# Patient Record
Sex: Female | Born: 1945 | Race: White | Hispanic: No | Smoking: Former smoker
Health system: Southern US, Community
[De-identification: ages and names within clinical notes are randomized; demographics above are authoritative.]

## PROBLEM LIST (undated history)

## (undated) DIAGNOSIS — N200 Calculus of kidney: Secondary | ICD-10-CM

## (undated) DIAGNOSIS — K219 Gastro-esophageal reflux disease without esophagitis: Secondary | ICD-10-CM

## (undated) DIAGNOSIS — E079 Disorder of thyroid, unspecified: Secondary | ICD-10-CM

## (undated) DIAGNOSIS — E785 Hyperlipidemia, unspecified: Secondary | ICD-10-CM

## (undated) DIAGNOSIS — E119 Type 2 diabetes mellitus without complications: Secondary | ICD-10-CM

## (undated) DIAGNOSIS — I10 Essential (primary) hypertension: Secondary | ICD-10-CM

## (undated) HISTORY — PX: CATARACT EXTRACTION: SUR2

## (undated) HISTORY — PX: KNEE ARTHROSCOPY: SUR90

## (undated) HISTORY — DX: Type 2 diabetes mellitus without complications: E11.9

## (undated) HISTORY — DX: Gastro-esophageal reflux disease without esophagitis: K21.9

## (undated) HISTORY — DX: Disorder of thyroid, unspecified: E07.9

## (undated) HISTORY — DX: Hyperlipidemia, unspecified: E78.5

## (undated) HISTORY — DX: Calculus of kidney: N20.0

## (undated) HISTORY — DX: Essential (primary) hypertension: I10

---

## 1999-03-02 ENCOUNTER — Encounter: Payer: Self-pay | Admitting: Emergency Medicine

## 1999-03-02 ENCOUNTER — Emergency Department (HOSPITAL_COMMUNITY): Admission: EM | Admit: 1999-03-02 | Discharge: 1999-03-02 | Payer: Self-pay | Admitting: Emergency Medicine

## 1999-04-23 ENCOUNTER — Encounter: Admission: RE | Admit: 1999-04-23 | Discharge: 1999-05-22 | Payer: Self-pay | Admitting: Internal Medicine

## 1999-06-20 ENCOUNTER — Encounter: Payer: Self-pay | Admitting: Urology

## 1999-06-21 ENCOUNTER — Ambulatory Visit (HOSPITAL_COMMUNITY): Admission: RE | Admit: 1999-06-21 | Discharge: 1999-07-06 | Payer: Self-pay | Admitting: Urology

## 1999-12-20 ENCOUNTER — Encounter: Payer: Self-pay | Admitting: Family Medicine

## 1999-12-20 ENCOUNTER — Encounter: Admission: RE | Admit: 1999-12-20 | Discharge: 1999-12-20 | Payer: Self-pay | Admitting: Family Medicine

## 2000-05-05 ENCOUNTER — Encounter: Admission: RE | Admit: 2000-05-05 | Discharge: 2000-05-05 | Payer: Self-pay | Admitting: Obstetrics and Gynecology

## 2000-05-05 ENCOUNTER — Encounter: Payer: Self-pay | Admitting: Obstetrics and Gynecology

## 2001-05-12 ENCOUNTER — Encounter: Payer: Self-pay | Admitting: Obstetrics and Gynecology

## 2001-05-12 ENCOUNTER — Encounter: Admission: RE | Admit: 2001-05-12 | Discharge: 2001-05-12 | Payer: Self-pay | Admitting: Obstetrics and Gynecology

## 2001-05-20 ENCOUNTER — Encounter: Payer: Self-pay | Admitting: Obstetrics and Gynecology

## 2001-05-20 ENCOUNTER — Encounter: Admission: RE | Admit: 2001-05-20 | Discharge: 2001-05-20 | Payer: Self-pay | Admitting: Obstetrics and Gynecology

## 2002-01-03 ENCOUNTER — Encounter: Payer: Self-pay | Admitting: Emergency Medicine

## 2002-01-03 ENCOUNTER — Emergency Department (HOSPITAL_COMMUNITY): Admission: EM | Admit: 2002-01-03 | Discharge: 2002-01-03 | Payer: Self-pay | Admitting: Plastic Surgery

## 2002-01-18 ENCOUNTER — Ambulatory Visit (HOSPITAL_BASED_OUTPATIENT_CLINIC_OR_DEPARTMENT_OTHER): Admission: RE | Admit: 2002-01-18 | Discharge: 2002-01-18 | Payer: Self-pay | Admitting: Urology

## 2002-01-18 ENCOUNTER — Encounter: Payer: Self-pay | Admitting: Urology

## 2002-05-21 ENCOUNTER — Encounter: Payer: Self-pay | Admitting: Obstetrics and Gynecology

## 2002-05-21 ENCOUNTER — Encounter: Admission: RE | Admit: 2002-05-21 | Discharge: 2002-05-21 | Payer: Self-pay | Admitting: Obstetrics and Gynecology

## 2003-05-25 ENCOUNTER — Encounter: Admission: RE | Admit: 2003-05-25 | Discharge: 2003-05-25 | Payer: Self-pay | Admitting: Obstetrics and Gynecology

## 2003-05-25 ENCOUNTER — Encounter: Payer: Self-pay | Admitting: Obstetrics and Gynecology

## 2003-11-18 ENCOUNTER — Ambulatory Visit (HOSPITAL_BASED_OUTPATIENT_CLINIC_OR_DEPARTMENT_OTHER): Admission: RE | Admit: 2003-11-18 | Discharge: 2003-11-18 | Payer: Self-pay | Admitting: Otolaryngology

## 2003-11-18 ENCOUNTER — Encounter (INDEPENDENT_AMBULATORY_CARE_PROVIDER_SITE_OTHER): Payer: Self-pay | Admitting: *Deleted

## 2003-11-18 ENCOUNTER — Ambulatory Visit (HOSPITAL_COMMUNITY): Admission: RE | Admit: 2003-11-18 | Discharge: 2003-11-18 | Payer: Self-pay | Admitting: Otolaryngology

## 2004-05-25 ENCOUNTER — Encounter: Admission: RE | Admit: 2004-05-25 | Discharge: 2004-05-25 | Payer: Self-pay | Admitting: Obstetrics and Gynecology

## 2004-08-31 ENCOUNTER — Ambulatory Visit: Payer: Self-pay | Admitting: Family Medicine

## 2004-09-19 ENCOUNTER — Ambulatory Visit: Payer: Self-pay | Admitting: Internal Medicine

## 2004-10-04 ENCOUNTER — Ambulatory Visit: Payer: Self-pay | Admitting: Internal Medicine

## 2004-12-03 ENCOUNTER — Ambulatory Visit: Payer: Self-pay | Admitting: Internal Medicine

## 2005-06-07 ENCOUNTER — Encounter: Admission: RE | Admit: 2005-06-07 | Discharge: 2005-06-07 | Payer: Self-pay | Admitting: Obstetrics and Gynecology

## 2005-07-21 ENCOUNTER — Encounter: Payer: Self-pay | Admitting: Internal Medicine

## 2005-07-21 LAB — CONVERTED CEMR LAB

## 2005-09-19 ENCOUNTER — Ambulatory Visit: Payer: Self-pay | Admitting: Internal Medicine

## 2005-10-02 ENCOUNTER — Ambulatory Visit: Payer: Self-pay | Admitting: Internal Medicine

## 2005-10-21 HISTORY — PX: URETEROSCOPY: SHX842

## 2006-02-18 ENCOUNTER — Encounter (INDEPENDENT_AMBULATORY_CARE_PROVIDER_SITE_OTHER): Payer: Self-pay | Admitting: Internal Medicine

## 2006-02-18 ENCOUNTER — Ambulatory Visit: Payer: Self-pay | Admitting: Family Medicine

## 2006-03-04 ENCOUNTER — Ambulatory Visit: Payer: Self-pay | Admitting: Family Medicine

## 2006-03-19 ENCOUNTER — Ambulatory Visit: Payer: Self-pay | Admitting: Internal Medicine

## 2006-04-11 ENCOUNTER — Ambulatory Visit: Payer: Self-pay | Admitting: Gastroenterology

## 2006-04-16 ENCOUNTER — Ambulatory Visit: Payer: Self-pay | Admitting: Internal Medicine

## 2006-05-08 ENCOUNTER — Ambulatory Visit: Payer: Self-pay | Admitting: Gastroenterology

## 2006-05-08 LAB — HM COLONOSCOPY

## 2006-05-14 ENCOUNTER — Ambulatory Visit: Payer: Self-pay | Admitting: Internal Medicine

## 2006-05-21 ENCOUNTER — Ambulatory Visit: Payer: Self-pay | Admitting: Internal Medicine

## 2006-06-06 ENCOUNTER — Ambulatory Visit (HOSPITAL_BASED_OUTPATIENT_CLINIC_OR_DEPARTMENT_OTHER): Admission: RE | Admit: 2006-06-06 | Discharge: 2006-06-06 | Payer: Self-pay | Admitting: Urology

## 2006-07-07 ENCOUNTER — Encounter: Admission: RE | Admit: 2006-07-07 | Discharge: 2006-07-07 | Payer: Self-pay | Admitting: Obstetrics and Gynecology

## 2006-07-13 ENCOUNTER — Emergency Department (HOSPITAL_COMMUNITY): Admission: EM | Admit: 2006-07-13 | Discharge: 2006-07-13 | Payer: Self-pay | Admitting: Family Medicine

## 2006-07-18 ENCOUNTER — Encounter: Admission: RE | Admit: 2006-07-18 | Discharge: 2006-07-18 | Payer: Self-pay | Admitting: Obstetrics and Gynecology

## 2006-09-01 ENCOUNTER — Ambulatory Visit: Payer: Self-pay | Admitting: Internal Medicine

## 2006-12-25 ENCOUNTER — Ambulatory Visit: Payer: Self-pay | Admitting: Internal Medicine

## 2006-12-25 LAB — CONVERTED CEMR LAB
ALT: 17 units/L (ref 0–40)
Albumin: 3.6 g/dL (ref 3.5–5.2)
BUN: 13 mg/dL (ref 6–23)
Chloride: 108 meq/L (ref 96–112)
Free T4: 0.6 ng/dL (ref 0.6–1.6)
Glucose, Bld: 106 mg/dL — ABNORMAL HIGH (ref 70–99)
LDL Cholesterol: 97 mg/dL (ref 0–99)
Phosphorus: 3.9 mg/dL (ref 2.3–4.6)
Total CHOL/HDL Ratio: 2.6

## 2007-02-18 ENCOUNTER — Encounter: Payer: Self-pay | Admitting: Internal Medicine

## 2007-02-18 DIAGNOSIS — I10 Essential (primary) hypertension: Secondary | ICD-10-CM | POA: Insufficient documentation

## 2007-02-18 DIAGNOSIS — E039 Hypothyroidism, unspecified: Secondary | ICD-10-CM | POA: Insufficient documentation

## 2007-02-18 DIAGNOSIS — K219 Gastro-esophageal reflux disease without esophagitis: Secondary | ICD-10-CM | POA: Insufficient documentation

## 2007-02-18 DIAGNOSIS — N200 Calculus of kidney: Secondary | ICD-10-CM | POA: Insufficient documentation

## 2007-03-04 ENCOUNTER — Ambulatory Visit: Payer: Self-pay | Admitting: Internal Medicine

## 2007-03-05 LAB — CONVERTED CEMR LAB
AST: 18 units/L (ref 0–37)
Albumin: 3.8 g/dL (ref 3.5–5.2)
Basophils Absolute: 0 10*3/uL (ref 0.0–0.1)
Basophils Relative: 0.7 % (ref 0.0–1.0)
Calcium: 9.2 mg/dL (ref 8.4–10.5)
Eosinophils Absolute: 0.1 10*3/uL (ref 0.0–0.6)
Eosinophils Relative: 2.6 % (ref 0.0–5.0)
HCT: 39.1 % (ref 36.0–46.0)
MCHC: 34.8 g/dL (ref 30.0–36.0)
MCV: 89.6 fL (ref 78.0–100.0)
Neutrophils Relative %: 52 % (ref 43.0–77.0)
Phosphorus: 3.5 mg/dL (ref 2.3–4.6)
Platelets: 153 10*3/uL (ref 150–400)
Potassium: 4.6 meq/L (ref 3.5–5.1)
RBC: 4.36 M/uL (ref 3.87–5.11)
RDW: 11.7 % (ref 11.5–14.6)
TSH: 0.83 microintl units/mL (ref 0.35–5.50)
Total Bilirubin: 0.6 mg/dL (ref 0.3–1.2)
Total Protein: 7 g/dL (ref 6.0–8.3)
Triglycerides: 108 mg/dL (ref 0–149)

## 2007-07-21 ENCOUNTER — Encounter: Admission: RE | Admit: 2007-07-21 | Discharge: 2007-07-21 | Payer: Self-pay | Admitting: Obstetrics and Gynecology

## 2007-09-07 ENCOUNTER — Ambulatory Visit: Payer: Self-pay | Admitting: Internal Medicine

## 2007-12-23 ENCOUNTER — Ambulatory Visit: Payer: Self-pay | Admitting: Family Medicine

## 2007-12-28 ENCOUNTER — Ambulatory Visit: Payer: Self-pay | Admitting: Family Medicine

## 2008-01-06 ENCOUNTER — Telehealth (INDEPENDENT_AMBULATORY_CARE_PROVIDER_SITE_OTHER): Payer: Self-pay | Admitting: *Deleted

## 2008-03-10 ENCOUNTER — Ambulatory Visit: Payer: Self-pay | Admitting: Internal Medicine

## 2008-03-10 DIAGNOSIS — E785 Hyperlipidemia, unspecified: Secondary | ICD-10-CM

## 2008-03-11 LAB — CONVERTED CEMR LAB
ALT: 19 units/L (ref 0–35)
AST: 18 units/L (ref 0–37)
Albumin: 3.8 g/dL (ref 3.5–5.2)
Alkaline Phosphatase: 68 units/L (ref 39–117)
Basophils Relative: 1.2 % — ABNORMAL HIGH (ref 0.0–1.0)
Cholesterol: 183 mg/dL (ref 0–200)
Creatinine, Ser: 0.8 mg/dL (ref 0.4–1.2)
Eosinophils Absolute: 0.1 10*3/uL (ref 0.0–0.7)
Eosinophils Relative: 2 % (ref 0.0–5.0)
GFR calc Af Amer: 94 mL/min
Glucose, Bld: 120 mg/dL — ABNORMAL HIGH (ref 70–99)
MCHC: 33.7 g/dL (ref 30.0–36.0)
MCV: 91.5 fL (ref 78.0–100.0)
Monocytes Absolute: 0.5 10*3/uL (ref 0.1–1.0)
Neutro Abs: 2.9 10*3/uL (ref 1.4–7.7)
Phosphorus: 3.8 mg/dL (ref 2.3–4.6)
RDW: 12.3 % (ref 11.5–14.6)
Sodium: 138 meq/L (ref 135–145)
VLDL: 22 mg/dL (ref 0–40)
WBC: 5.4 10*3/uL (ref 4.5–10.5)

## 2008-07-21 ENCOUNTER — Encounter: Admission: RE | Admit: 2008-07-21 | Discharge: 2008-07-21 | Payer: Self-pay | Admitting: Obstetrics and Gynecology

## 2008-09-06 ENCOUNTER — Ambulatory Visit: Payer: Self-pay | Admitting: Internal Medicine

## 2008-09-06 DIAGNOSIS — R7301 Impaired fasting glucose: Secondary | ICD-10-CM

## 2008-09-07 LAB — CONVERTED CEMR LAB
Glucose, Bld: 112 mg/dL — ABNORMAL HIGH (ref 70–99)
Hgb A1c MFr Bld: 5.9 % (ref 4.6–6.0)

## 2008-09-19 ENCOUNTER — Telehealth (INDEPENDENT_AMBULATORY_CARE_PROVIDER_SITE_OTHER): Payer: Self-pay | Admitting: *Deleted

## 2008-12-01 ENCOUNTER — Encounter (INDEPENDENT_AMBULATORY_CARE_PROVIDER_SITE_OTHER): Payer: Self-pay | Admitting: *Deleted

## 2008-12-13 ENCOUNTER — Ambulatory Visit: Payer: Self-pay | Admitting: Internal Medicine

## 2008-12-30 ENCOUNTER — Ambulatory Visit: Payer: Self-pay | Admitting: Gastroenterology

## 2009-02-03 ENCOUNTER — Ambulatory Visit: Payer: Self-pay | Admitting: Gastroenterology

## 2009-02-07 ENCOUNTER — Telehealth (INDEPENDENT_AMBULATORY_CARE_PROVIDER_SITE_OTHER): Payer: Self-pay | Admitting: *Deleted

## 2009-03-24 ENCOUNTER — Ambulatory Visit: Payer: Self-pay | Admitting: Internal Medicine

## 2009-03-27 LAB — CONVERTED CEMR LAB
BUN: 14 mg/dL (ref 6–23)
Basophils Absolute: 0 10*3/uL (ref 0.0–0.1)
Bilirubin, Direct: 0.1 mg/dL (ref 0.0–0.3)
CO2: 31 meq/L (ref 19–32)
Cholesterol: 196 mg/dL (ref 0–200)
Eosinophils Absolute: 0.2 10*3/uL (ref 0.0–0.7)
LDL Cholesterol: 96 mg/dL (ref 0–99)
Lymphocytes Relative: 34.5 % (ref 12.0–46.0)
Lymphs Abs: 2 10*3/uL (ref 0.7–4.0)
Monocytes Absolute: 0.6 10*3/uL (ref 0.1–1.0)
Neutrophils Relative %: 52.3 % (ref 43.0–77.0)
Platelets: 156 10*3/uL (ref 150.0–400.0)
Potassium: 4.1 meq/L (ref 3.5–5.1)
RBC: 4.35 M/uL (ref 3.87–5.11)
RDW: 11.9 % (ref 11.5–14.6)
Sodium: 144 meq/L (ref 135–145)
TSH: 2.03 microintl units/mL (ref 0.35–5.50)
WBC: 5.8 10*3/uL (ref 4.5–10.5)

## 2009-04-10 ENCOUNTER — Ambulatory Visit: Payer: Self-pay | Admitting: Gastroenterology

## 2009-05-26 ENCOUNTER — Ambulatory Visit: Payer: Self-pay | Admitting: Gastroenterology

## 2009-06-19 ENCOUNTER — Telehealth: Payer: Self-pay | Admitting: Internal Medicine

## 2009-07-24 ENCOUNTER — Encounter: Admission: RE | Admit: 2009-07-24 | Discharge: 2009-07-24 | Payer: Self-pay | Admitting: Obstetrics and Gynecology

## 2009-09-26 ENCOUNTER — Ambulatory Visit: Payer: Self-pay | Admitting: Internal Medicine

## 2009-09-26 DIAGNOSIS — R32 Unspecified urinary incontinence: Secondary | ICD-10-CM

## 2009-10-11 ENCOUNTER — Ambulatory Visit: Payer: Self-pay | Admitting: Internal Medicine

## 2010-03-21 ENCOUNTER — Telehealth: Payer: Self-pay | Admitting: Internal Medicine

## 2010-03-21 ENCOUNTER — Emergency Department (HOSPITAL_COMMUNITY): Admission: EM | Admit: 2010-03-21 | Discharge: 2010-03-21 | Payer: Self-pay | Admitting: Internal Medicine

## 2010-03-27 ENCOUNTER — Ambulatory Visit: Payer: Self-pay | Admitting: Internal Medicine

## 2010-03-27 LAB — CONVERTED CEMR LAB
Glucose, Urine, Semiquant: NEGATIVE
Ketones, urine, test strip: NEGATIVE
Nitrite: NEGATIVE
Specific Gravity, Urine: 1.01
pH: 7.5

## 2010-03-29 LAB — CONVERTED CEMR LAB
AST: 17 units/L (ref 0–37)
Basophils Absolute: 0.1 10*3/uL (ref 0.0–0.1)
Basophils Relative: 1.1 % (ref 0.0–3.0)
Bilirubin, Direct: 0.1 mg/dL (ref 0.0–0.3)
CO2: 29 meq/L (ref 19–32)
Calcium: 9.7 mg/dL (ref 8.4–10.5)
Cholesterol: 183 mg/dL (ref 0–200)
Eosinophils Relative: 1.4 % (ref 0.0–5.0)
GFR calc non Af Amer: 65.74 mL/min (ref >60)
Glucose, Bld: 113 mg/dL — ABNORMAL HIGH (ref 70–99)
Hemoglobin: 13.4 g/dL (ref 12.0–15.0)
Lymphocytes Relative: 23.6 % (ref 12.0–46.0)
Lymphs Abs: 1.5 10*3/uL (ref 0.7–4.0)
MCV: 88.8 fL (ref 78.0–100.0)
Neutro Abs: 4.1 10*3/uL (ref 1.4–7.7)
Neutrophils Relative %: 65.5 % (ref 43.0–77.0)
Phosphorus: 3.5 mg/dL (ref 2.3–4.6)
Platelets: 162 10*3/uL (ref 150.0–400.0)
Potassium: 4.6 meq/L (ref 3.5–5.1)
RBC: 4.38 M/uL (ref 3.87–5.11)
RDW: 13.1 % (ref 11.5–14.6)
TSH: 1.54 microintl units/mL (ref 0.35–5.50)
Total Bilirubin: 0.8 mg/dL (ref 0.3–1.2)
Triglycerides: 135 mg/dL (ref 0.0–149.0)
VLDL: 27 mg/dL (ref 0.0–40.0)

## 2010-03-30 ENCOUNTER — Telehealth: Payer: Self-pay | Admitting: Internal Medicine

## 2010-03-31 ENCOUNTER — Encounter: Payer: Self-pay | Admitting: Internal Medicine

## 2010-04-06 ENCOUNTER — Telehealth (INDEPENDENT_AMBULATORY_CARE_PROVIDER_SITE_OTHER): Payer: Self-pay | Admitting: *Deleted

## 2010-06-15 ENCOUNTER — Telehealth: Payer: Self-pay | Admitting: Internal Medicine

## 2010-06-15 ENCOUNTER — Ambulatory Visit: Payer: Self-pay | Admitting: Cardiology

## 2010-06-15 ENCOUNTER — Encounter: Payer: Self-pay | Admitting: Internal Medicine

## 2010-06-15 ENCOUNTER — Ambulatory Visit: Payer: Self-pay | Admitting: Family Medicine

## 2010-06-15 ENCOUNTER — Observation Stay (HOSPITAL_COMMUNITY): Admission: EM | Admit: 2010-06-15 | Discharge: 2010-06-16 | Payer: Self-pay | Admitting: Emergency Medicine

## 2010-06-15 LAB — CONVERTED CEMR LAB

## 2010-06-16 LAB — CONVERTED CEMR LAB
AST: 30 units/L (ref 0–37)
Albumin: 4 g/dL (ref 3.5–5.2)
Alkaline Phosphatase: 86 units/L (ref 39–117)
Basophils Absolute: 0.1 10*3/uL (ref 0.0–0.1)
Basophils Relative: 0.3 % (ref 0.0–3.0)
Bilirubin, Direct: 0.2 mg/dL (ref 0.0–0.3)
CO2: 24 meq/L (ref 19–32)
Eosinophils Absolute: 0 10*3/uL (ref 0.0–0.7)
Eosinophils Relative: 0 % (ref 0.0–5.0)
GFR calc non Af Amer: 39.17 mL/min (ref >60)
Glucose, Bld: 169 mg/dL — ABNORMAL HIGH (ref 70–99)
Neutro Abs: 17.9 10*3/uL — ABNORMAL HIGH (ref 1.4–7.7)
Platelets: 155 10*3/uL (ref 150.0–400.0)
Potassium: 4.3 meq/L (ref 3.5–5.1)
Sodium: 140 meq/L (ref 135–145)
WBC: 19.4 10*3/uL (ref 4.5–10.5)

## 2010-06-18 ENCOUNTER — Ambulatory Visit (HOSPITAL_COMMUNITY): Admission: RE | Admit: 2010-06-18 | Discharge: 2010-06-18 | Payer: Self-pay | Admitting: Urology

## 2010-06-29 ENCOUNTER — Encounter: Payer: Self-pay | Admitting: Internal Medicine

## 2010-07-21 HISTORY — PX: LITHOTRIPSY: SUR834

## 2010-07-27 ENCOUNTER — Encounter: Admission: RE | Admit: 2010-07-27 | Discharge: 2010-07-27 | Payer: Self-pay | Admitting: Internal Medicine

## 2010-07-27 LAB — HM MAMMOGRAPHY

## 2010-07-30 ENCOUNTER — Encounter: Payer: Self-pay | Admitting: Internal Medicine

## 2010-09-23 IMAGING — MG MM SCREEN MAMMOGRAM BILATERAL
4 series · 4 of 4 positions shown · non-contrast
Comparison: none

DG SCREEN MAMMOGRAM BILATERAL
Bilateral CC and MLO view(s) were taken.

DIGITAL SCREENING MAMMOGRAM WITH CAD:
The breast tissue is heterogeneously dense.  No masses or malignant type calcifications are 
identified.  Compared with prior studies.

[R CC]
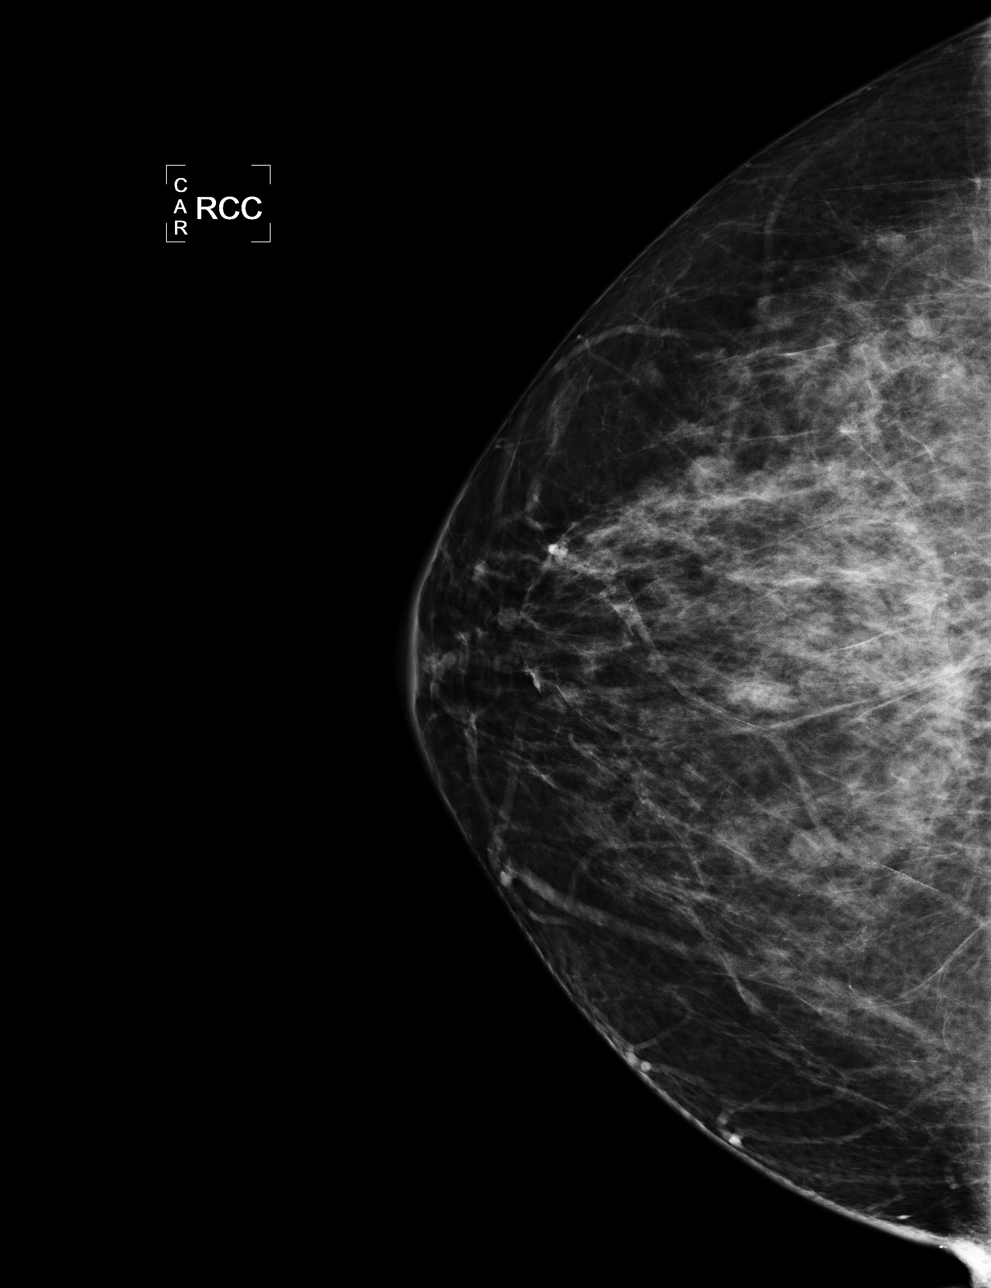

[L CC]
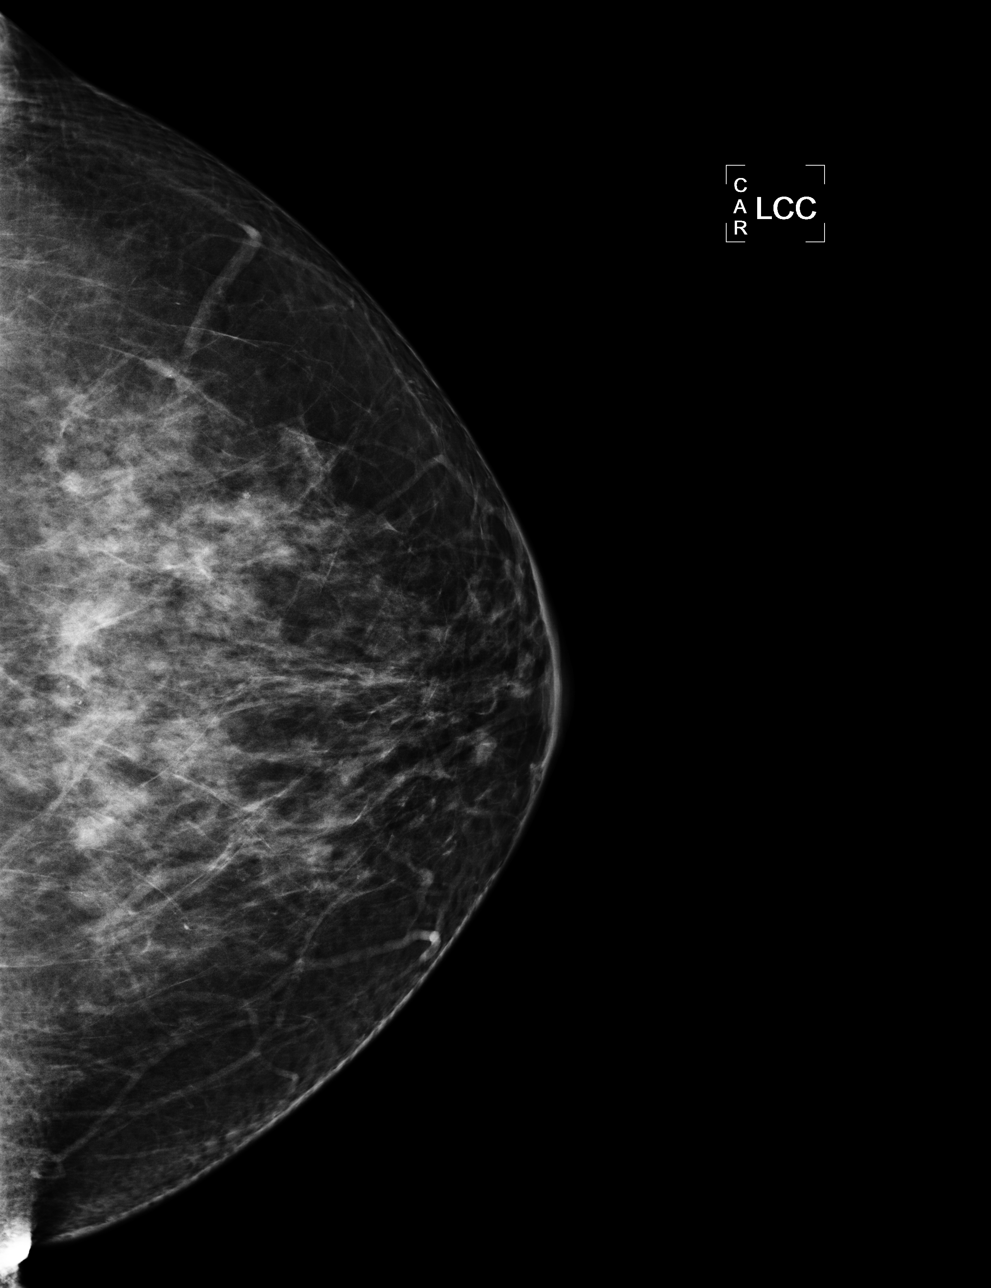

[L MLO]
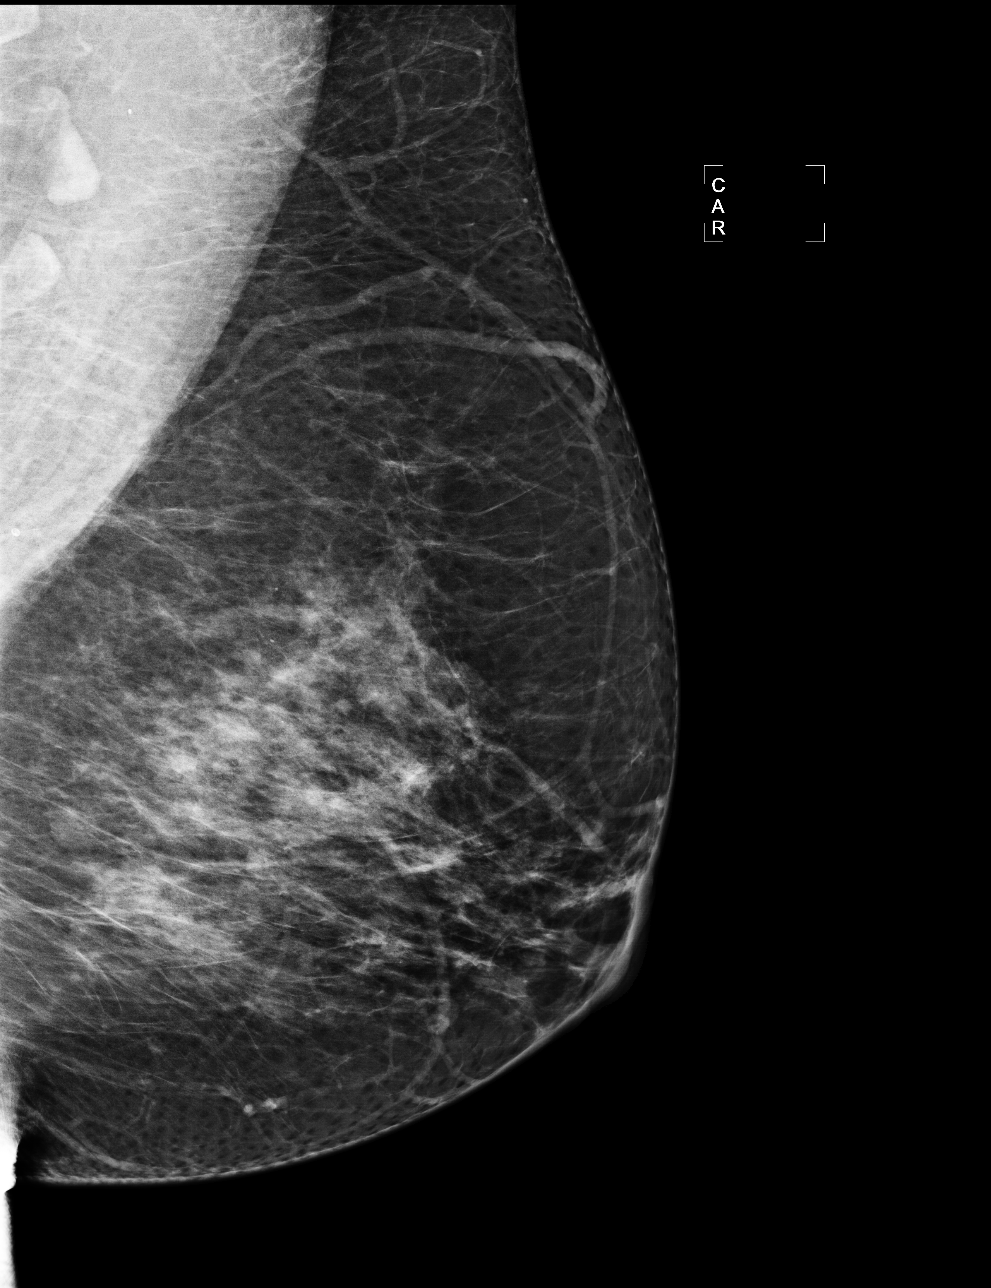

[R MLO]
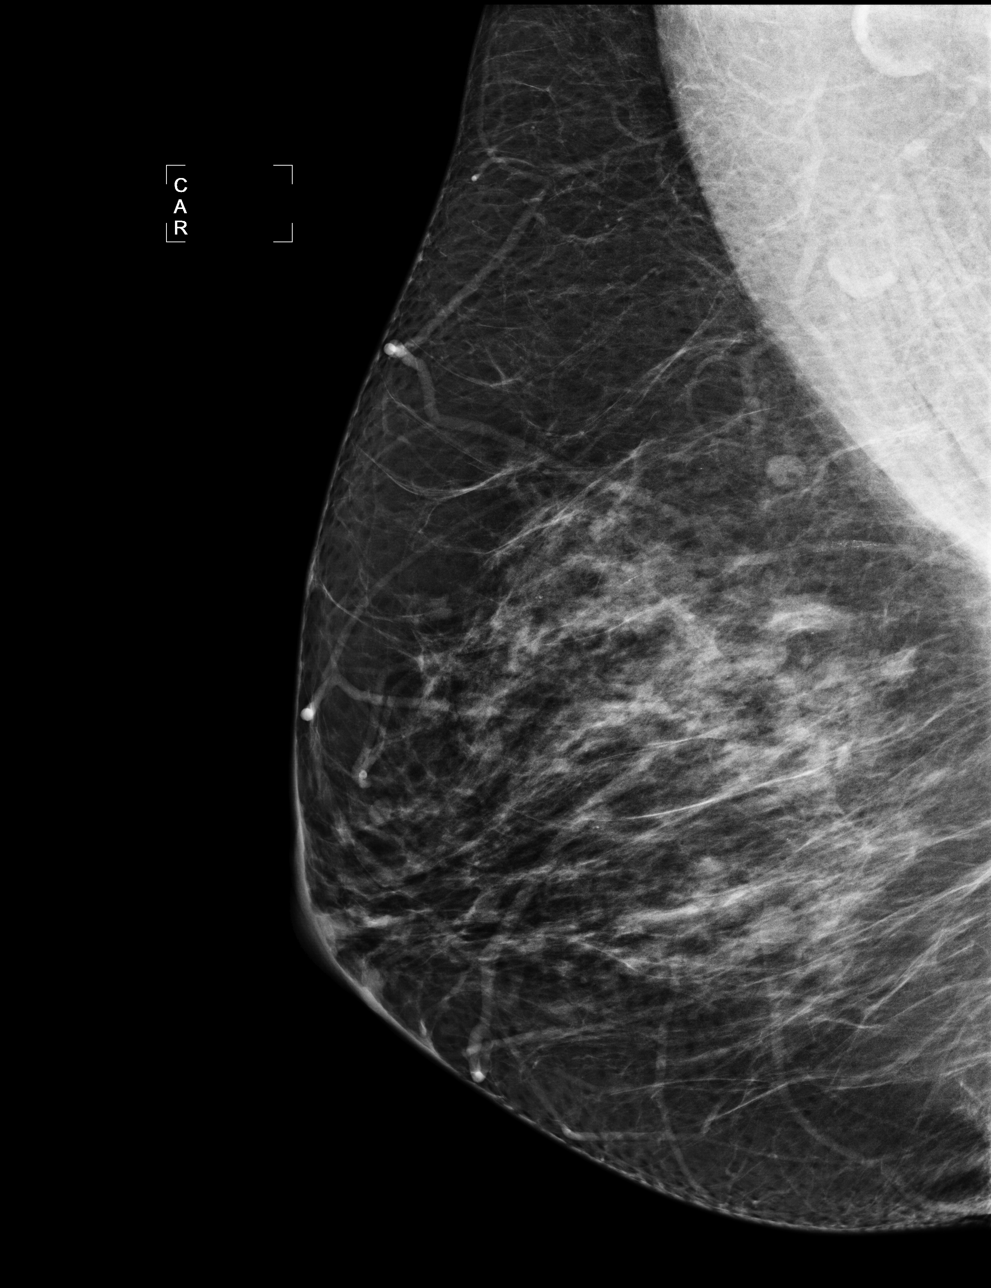

[4 of 4 positions shown; findings below may reference images not displayed]

IMPRESSION: No specific mammographic evidence of malignancy.  Next screening mammogram is recommended in one 
year.

ASSESSMENT: Negative - BI-RADS 1

Screening mammogram in 1 year.
ANALYZED BY COMPUTER AIDED DETECTION. , THIS PROCEDURE WAS A DIGITAL MAMMOGRAM.

## 2010-09-28 ENCOUNTER — Ambulatory Visit: Payer: Self-pay | Admitting: Internal Medicine

## 2010-10-08 ENCOUNTER — Ambulatory Visit: Payer: Self-pay | Admitting: Internal Medicine

## 2010-10-08 DIAGNOSIS — T887XXA Unspecified adverse effect of drug or medicament, initial encounter: Secondary | ICD-10-CM | POA: Insufficient documentation

## 2010-10-12 ENCOUNTER — Encounter: Payer: Self-pay | Admitting: Internal Medicine

## 2010-11-11 ENCOUNTER — Encounter: Payer: Self-pay | Admitting: Obstetrics and Gynecology

## 2010-11-20 NOTE — Progress Notes (Signed)
Summary: Critical Lab WBC 19.4  Phone Note Other Incoming   Caller:  [facility] Summary of Call: Critical Labs pt's  WCB is 19.4. Clydie Braun will fax report. Initial call taken by: Liane Comber CMA Duncan Dull),  June 15, 2010 1:26 PM  Follow-up for Phone Call        Dr.Vera Furniss and Dr.Copland are not in this afternoon, will forward to Dr.Duncan. DeShannon Smith CMA Duncan Dull)  June 15, 2010 2:02 PM   Additional Follow-up for Phone Call Additional follow up Details #1::        I have d/w Dr. Patsy Lager.  We both agree that patient should start on cipro 500mg  by mouth two times a day x10d.  #20, no rf.  Please call this in and notify patient.  If she has progressive pain or if she has fever, then she should go to ER over the weekend.  O/w she is to follow up with Uro on Monday.  Additional Follow-up by: Crawford Givens MD,  June 15, 2010 2:10 PM    Additional Follow-up for Phone Call Additional follow up Details #2::    left message advising patient of results and medication called to [facility] also advised patient or go to er if any symptoms worsen or she get a fever. Patient also advised to call office if any questions Follow-up by: Benny Lennert CMA Duncan Dull),  June 15, 2010 2:21 PM  New/Updated Medications: CIPRO 500 MG TABS (CIPROFLOXACIN HCL) take one tablet two times daily Prescriptions: CIPRO 500 MG TABS (CIPROFLOXACIN HCL) take one tablet two times daily  #20 x 0   Entered by:   Benny Lennert CMA (AAMA)   Authorized by:   Crawford Givens MD   Signed by:   Benny Lennert CMA (AAMA) on 06/15/2010   Method used:   Electronically to        [facility]* (retail)       [address]       [place], [place]  [address]       Ph: [phone]       Fax: [phone]   RxID:   [id number]   Appended Document: Critical Lab WBC 19.4 attempted to reach patient 2 more times with no success.Consuello Masse CMA

## 2010-11-20 NOTE — Medication Information (Signed)
Summary: Approved/[facility]  Approved/[facility]   Imported By: Lester Newtown Grant 04/07/2010 09:54:20  _____________________________________________________________________  External Attachment:    Type:   Image     Comment:   External Document

## 2010-11-20 NOTE — Assessment & Plan Note (Signed)
Summary: RIGHT SIDE PAIN/CLE   Vital Signs:  Patient profile:   65 year old female Height:      67 inches Weight:      208.8 pounds BMI:     32.82 Temp:     98.2 degrees F oral Pulse rate:   72 / minute Pulse rhythm:   regular BP sitting:   140 / 90  (left arm) Cuff size:   large  Vitals Entered By: Benny Lennert CMA Duncan Dull) (June 15, 2010 9:10 AM)  History of Present Illness: Chief complaint Right side  pain  65 year old female:  pleasant female, status post hysterectomy, also status post bladder tack in the past with a history of nephrolithiasis x4 who presents with awakening in the evening with 10-10 abdominal pain. This is diffuse and on the right side, however more in the epigastric, right upper quadrant and right lower quadrant areas.  She did have a fever up to 99.72F. She has been nauseous and vomiting. No diarrhea. No URI type symptoms.  Some flank pain, however no dysuria or urgency.  Kidney stones in the past Has had about four  Started about ten thirty  10/10 pain 99.5  throwing up some clear Has been drinking a lot.   s/p hysterectomy bladder tack   ROS: as above, no cough, chest pain, fatigue, rashes.  Allergies: 1)  ! Aspirin (Aspirin) 2)  ! Augmentin 3)  ! Codeine Sulfate (Codeine Sulfate) 4)  ! Benadryl (Diphenhydramine Hcl) 5)  ! Lodine 6)  ! Lisinopril (Lisinopril)  Past History:  Past medical, surgical, family and social histories (including risk factors) reviewed, and no changes noted (except as noted below).  Past Medical History: Reviewed history from 09/26/2009 and no changes required. GERD Hypertension Hypothyroidism Hyperlipidemia Impaired fasting glucose Urinary incontinence--------------------------------Dr Dahlstedt  Dr Starr Sinclair  Past Surgical History: Reviewed history from 12/30/2008 and no changes required. CT head neg. 12/20/1999 Sttress ECHO neg. 12/24/1999 Event monitor neg. 01/02/2000 Ureteroscopy-    caliceal diverticulum 2007   Family History: Reviewed history from 12/30/2008 and no changes required. Dad had MI, HTN, arthritis Mom had DM, HTN    Died of Altzheirmer's @84  (4?08) Lung cancer in [place] GF died of MI Granddaughter with IDDM   Social History: Reviewed history from 12/30/2008 and no changes required. 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 No apparent alopecia or balding. Ears:  no external deformities.   Nose:  no external deformity.   Neck:  No deformities, masses, or tenderness noted. Lungs:  Normal respiratory effort, chest expands symmetrically. Lungs are clear to auscultation, no crackles or wheezes. Heart:  Normal rate and regular rhythm. S1 and S2 normal without gallop, murmur, click, rub or other extra sounds. Abdomen:  distended abdomen. Soft. Active bowel sounds. No guarding. No rigidity. No rebound tenderness.  There is tenderness to palpation, moderately present in the epigastric region, right upper quadrant, and right lower quadrant.  No hepatosplenomegaly. No hernias. Extremities:  No clubbing, cyanosis, edema, or deformity noted with normal full range of motion of all joints.   Neurologic:  alert & oriented X3 and gait normal.     Impression & Recommendations:  Problem # 1:  ABDOMINAL PAIN, GENERALIZED (ICD-789.07) ill-defined abdominal pain.  Obtained a CT of the abdomen and pelvis without contrast to evaluate for potential renal stones,  appendicitis, gallbladder disease, bowel perforation, diverticulitis. Believe this needs to be without contrast the best defined whether or not a stone is present which is highest on my differential.  Orders: Radiology Referral (Radiology) Venipuncture  ([address]109) TLB-BMP (Basic Metabolic Panel-BMET) (80048-METABOL) TLB-CBC Platelet - w/Differential (85025-CBCD) TLB-Hepatic/Liver Function Pnl (80076-HEPATIC) TLB-Lipase (83690-LIPASE)  Problem # 2:  ABDOMINAL PAIN, RIGHT LOWER QUADRANT (ICD-789.03)  Orders: UA Dipstick w/o Micro (manual) (60454)  Complete Medication List: 1)  Synthroid 100 Mcg Tabs (Levothyroxine sodium) .... Take one by mouth daily 2)  Tenormin 100 Mg Tabs (Atenolol) .... Take one by mouth daily 3)  Lipitor 20 Mg Tabs (Atorvastatin calcium) .... Take one by mouth daily 4)  Vesicare 5 Mg Tabs (Solifenacin succinate) .Marland Kitchen.. 1 tablet by mouth once daily 5)  Omeprazole 20 Mg Cpdr (Omeprazole) .Marland Kitchen.. 1 daily about 30 minutes before eating for acid reflux 6)  Hydrocodone-acetaminophen 5-500 Mg Tabs (Hydrocodone-acetaminophen) .Marland Kitchen.. 1 - 2 by mouth q 4 hours as needed pain  Patient Instructions: 1)  Referral Appointment Information 2)  Day/Date: 3)  Time: 4)  Place/MD: 5)  Address: 6)  Phone/Fax: 7)  Patient given appointment information. Information/Orders faxed/mailed.  Prescriptions: HYDROCODONE-ACETAMINOPHEN 5-500 MG TABS (HYDROCODONE-ACETAMINOPHEN) 1 - 2 by mouth q 4 hours as needed pain  #40 x 0   Entered and Authorized by:   Hannah Beat MD   Signed by:   Hannah Beat MD on 06/15/2010   Method used:   Print then Give to Patient   RxID:   [id number]   Current Allergies (reviewed today): ! ASPIRIN (ASPIRIN) ! AUGMENTIN ! CODEINE SULFATE (CODEINE SULFATE) ! BENADRYL (DIPHENHYDRAMINE HCL) ! LODINE ! LISINOPRIL (LISINOPRIL)  Laboratory Results   Urine Tests  Date/Time Received: June 15, 2010 9:17 AM  Date/Time Reported: June 15, 2010 9:17 AM    Blood: small   (Normal Range: Negative)    Comments: Patient had taken azo so i could not get any other measure ments also patient only gave enough urine to do ua

## 2010-11-20 NOTE — Assessment & Plan Note (Signed)
Summary: CPX /RBH   Vital Signs:  Patient profile:   65 year old female Weight:      209 pounds BMI:     32.85 Temp:     99.2 degrees F oral Pulse rate:   68 / minute Pulse rhythm:   regular BP sitting:   150 / 90  (left arm) Cuff size:   large  Vitals Entered By: Mervin Hack CMA Duncan Dull) (March 27, 2010 8:55 AM) CC: adult physical/ sinus infection/UTI   History of Present Illness: Due to see  Dr Retta Diones later this month having urinary symptoms now though some symptoms for 2 weeks tried azo some dysuria and increased freq no fever  Seen in ER CT of head showed sinus opacification has had some right ear pain Vick's sinex helped some  Not due for pap smear this year Goes yearly to breast center for exam and mammo in October    Allergies: 1)  ! Aspirin (Aspirin) 2)  ! Augmentin 3)  ! Codeine Sulfate (Codeine Sulfate) 4)  ! Benadryl (Diphenhydramine Hcl) 5)  ! Lodine 6)  ! Lisinopril (Lisinopril)  Past History:  Past medical, surgical, family and social histories (including risk factors) reviewed for relevance to current acute and chronic problems.  Past Medical History: Reviewed history from 09/26/2009 and no changes required. GERD Hypertension Hypothyroidism Hyperlipidemia Impaired fasting glucose Urinary incontinence--------------------------------Dr Dahlstedt  Dr Starr Sinclair  Past Surgical History: Reviewed history from 12/30/2008 and no changes required. CT head neg. 12/20/1999 Sttress ECHO neg. 12/24/1999 Event monitor neg. 01/02/2000 Ureteroscopy-   caliceal diverticulum 2007   Family History: Reviewed history from 12/30/2008 and no changes required. Dad had MI, HTN, arthritis Mom had DM, HTN    Died of Altzheirmer's @84  (4?08) Lung cancer in [place] GF died of MI Granddaughter with IDDM   Social History: Reviewed history from 12/30/2008 and no changes required. 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  Occ daytime sleepiness but no abnormal somnolence wears seat belt. Eyes:  Denies double vision and vision loss-1 eye; cataract not ready for surgery yet--6 month recall though. ENT:  Complains of decreased hearing; denies ringing in ears; right ear decreased hearing in last week teeth fine--regular with dentist. CV:  Complains of fainting; denies chest pain or discomfort, difficulty breathing at night, difficulty breathing while lying down, palpitations, and shortness of breath with exertion; had syncope after stool --ER eval negative No exercise--did buy treadmill and had tried some till work schedule worsened. Resp:  Complains of cough; denies shortness of breath; some cough this AM. GI:  Complains of indigestion; denies abdominal pain, bloody stools, change in bowel habits, dark tarry stools, nausea, and vomiting; occ reflux---meds do help. GU:  Complains of dysuria and incontinence; No sex--no problem. MS:  Complains of joint pain; denies joint swelling; right 2nd finger pain chronically. Derm:  Denies lesion(s) and rash. Neuro:  Complains of headaches; denies numbness, tingling, and weakness; only occ headaches. Psych:  Denies anxiety and depression. Heme:  Denies abnormal bruising and enlarge lymph nodes. Allergy:  Complains of seasonal allergies and sneezing; occ problems after mowing.  Physical Exam  General:  alert and normal appearance.   Eyes:  pupils equal, pupils round, pupils reactive to light, and no optic disk abnormalities.   Ears:  R ear normal and L ear normal.   Mouth:  no erythema, no exudates, and no lesions.   Neck:  supple, no masses, no thyromegaly, no carotid bruits, and no cervical lymphadenopathy.   Lungs:  normal respiratory effort and normal breath sounds.   Heart:  normal rate, regular rhythm, no murmur, and no gallop.    Abdomen:  soft, non-tender, and no masses.   Msk:  no joint tenderness and no joint swelling.   Pulses:  faint in feet Extremities:  no sig edema Neurologic:  alert & oriented X3, strength normal in all extremities, and gait normal.   Skin:  no rashes and no suspicious lesions.   Axillary Nodes:  No palpable lymphadenopathy Psych:  normally interactive, good eye contact, not anxious appearing, and not depressed appearing.     Impression & Recommendations:  Problem # 1:  HEALTH MAINTENANCE EXAM (ICD-V70.0) Assessment Comment Only will get mammo later this year not due for pap discussed fitness  Problem # 2:  URINARY INCONTINENCE (ICD-788.30) Assessment: Comment Only satisfied with med  Problem # 3:  ACUTE CYSTITIS (ICD-595.0) Assessment: New  will treat for 10 days since possible sinus infection also  Her updated medication list for this problem includes:    Vesicare 5 Mg Tabs (Solifenacin succinate) .Marland Kitchen... 1 tablet by mouth once daily    Cefuroxime Axetil 250 Mg Tabs (Cefuroxime axetil) .Marland Kitchen... 1 by mouth two times a day for urinary infection  Orders: UA Dipstick w/o Micro (manual) (16109)  Problem # 4:  HYPERTENSION (ICD-401.9) Assessment: Improved  BP some better  no changes  Her updated medication list for this problem includes:    Tenormin 100 Mg Tabs (Atenolol) .Marland Kitchen... Take one by mouth daily  BP today: 150/90 Prior BP: 160/100 (10/11/2009)  Labs Reviewed: K+: 4.1 (03/24/2009) Creat: : 0.9 (03/24/2009)   Chol: 196 (03/24/2009)   HDL: 68.00 (03/24/2009)   LDL: 96 (03/24/2009)   TG: 158.0 (03/24/2009)  Orders: TLB-Renal Function Panel (80069-RENAL) TLB-CBC Platelet - w/Differential (85025-CBCD)  Problem # 5:  HYPERLIPIDEMIA (ICD-272.4) Assessment: Unchanged  due for labs  Her updated medication list for this problem includes:    Lipitor 20 Mg Tabs (Atorvastatin calcium) .Marland Kitchen... Take one by mouth daily  Labs Reviewed: SGOT: 22 (03/24/2009)   SGPT: 31  (03/24/2009)   HDL:68.00 (03/24/2009), 66.4 (03/10/2008)  LDL:96 (03/24/2009), 94 (03/10/2008)  Chol:196 (03/24/2009), 183 (03/10/2008)  Trig:158.0 (03/24/2009), 111 (03/10/2008)  Orders: TLB-Lipid Panel (80061-LIPID) TLB-Hepatic/Liver Function Pnl (80076-HEPATIC) Venipuncture (60454)  Problem # 6:  HYPOTHYROIDISM (ICD-244.9) Assessment: Unchanged  due for labs  Her updated medication list for this problem includes:    Synthroid 100 Mcg Tabs (Levothyroxine sodium) .Marland Kitchen... Take one by mouth daily  Labs Reviewed: TSH: 2.03 (03/24/2009)    HgBA1c: 5.9 (09/06/2008) Chol: 196 (03/24/2009)   HDL: 68.00 (03/24/2009)   LDL: 96 (03/24/2009)   TG: 158.0 (03/24/2009)  Orders: TLB-T4 (Thyrox), Free [id number]) TLB-TSH (Thyroid Stimulating Hormone) (84443-TSH)  Complete Medication List: 1)  Synthroid 100 Mcg Tabs (Levothyroxine sodium) .... Take one by mouth daily 2)  Tenormin 100 Mg Tabs (Atenolol) .... Take one by mouth daily 3)  Lipitor 20 Mg Tabs (Atorvastatin calcium) .... Take one by mouth daily 4)  Vesicare 5 Mg Tabs (Solifenacin succinate) .Marland Kitchen.. 1 tablet by mouth once daily 5)  Omeprazole 20 Mg Cpdr (Omeprazole) .Marland Kitchen.. 1 daily about 30 minutes before eating for acid reflux 6)  Cefuroxime Axetil 250 Mg Tabs (Cefuroxime axetil) .Marland Kitchen.. 1 by mouth two times a day for urinary infection  Patient Instructions: 1)  Please schedule a follow-up  appointment in 6 months .  Prescriptions: CEFUROXIME AXETIL 250 MG TABS (CEFUROXIME AXETIL) 1 by mouth two times a day for urinary infection  #20 x 0   Entered and Authorized by:   Cindee Salt MD   Signed by:   Cindee Salt MD on 03/27/2010   Method used:   Electronically to        [facility]* (retail)       [address]       [place], [place]  [address]       Ph: [phone]       Fax: [phone]   RxID:   [id number]   Current Allergies (reviewed today): ! ASPIRIN (ASPIRIN) ! AUGMENTIN ! CODEINE SULFATE (CODEINE  SULFATE) ! BENADRYL (DIPHENHYDRAMINE HCL) ! LODINE ! LISINOPRIL (LISINOPRIL)  Laboratory Results   Urine Tests  Date/Time Received: March 27, 2010 9:00 AM Date/Time Reported: March 27, 2010 9:00 AM  Routine Urinalysis   Color: yellow Appearance: Hazy Glucose: negative   (Normal Range: Negative) Bilirubin: negative   (Normal Range: Negative) Ketone: negative   (Normal Range: Negative) Spec. Gravity: 1.010   (Normal Range: 1.003-1.035) Blood: trace-lysed   (Normal Range: Negative) pH: 7.5   (Normal Range: 5.0-8.0) Protein: trace   (Normal Range: Negative) Urobilinogen: 0.2   (Normal Range: 0-1) Nitrite: negative   (Normal Range: Negative) Leukocyte Esterace: moderate   (Normal Range: Negative)

## 2010-11-20 NOTE — Progress Notes (Signed)
Summary: pt fainted  Phone Note Call from Patient   Caller: Daughter- Revonda Standard [phone] Summary of Call: Pt had a fainting spell at work and was taken to ER.  They are wanting to admit her but pt doesnt want to stay.  All the tests that they have done are normal- blood work, CT, and she feels fine.  She has appt with you on tuesday.  Daughter wants to know if ok to wait till then. Initial call taken by: Lowella Petties CMA,  March 21, 2010 12:05 PM  Follow-up for Phone Call        If they wanted her to be admitted, I would not contradict that. If she insists on going home, and she has any further symptoms, she should call 911 immediately otherwise I will plan to see her on Tuesday Follow-up by: Cindee Salt MD,  March 21, 2010 12:36 PM  Additional Follow-up for Phone Call Additional follow up Details #1::        spoke with daughter and advised results, she understood. Additional Follow-up by: Mervin Hack CMA Duncan Dull),  March 21, 2010 12:59 PM

## 2010-11-20 NOTE — Letter (Signed)
Summary: Results Follow up Letter  [facility]  [address] [place], [place] [address]   Phone: [phone]  Fax: [phone]    07/30/2010 MRN: [id number]  Sherry Harris [address] [place], [place]  [address]  Dear Ms. JICHA,  The following are the results of your recent test(s):  Test         Result    Pap Smear:        Normal _____  Not Normal _____ Comments: ______________________________________________________ Cholesterol: LDL(Bad cholesterol):         Your goal is less than:         HDL (Good cholesterol):       Your goal is more than: Comments:  ______________________________________________________ Mammogram:        Normal __X___  Not Normal _____ Comments: mammo is fine repeat recommended in 1-2 years  ___________________________________________________________________ Hemoccult:        Normal _____  Not normal _______ Comments:    _____________________________________________________________________ Other Tests:    We routinely do not discuss normal results over the telephone.  If you desire a copy of the results, or you have any questions about this information we can discuss them at your next office visit.   Sincerely,      Tillman Abide, MD

## 2010-11-20 NOTE — Letter (Signed)
Summary: [facility]  [facility]   Imported By: Lanelle Bal 06/22/2010 13:57:19  _____________________________________________________________________  External Attachment:    Type:   Image     Comment:   External Document

## 2010-11-20 NOTE — Assessment & Plan Note (Signed)
Summary: ROA FOR 6 MONTH FOLLOW-UP/JRR   Vital Signs:  Patient profile:   65 year old female Weight:      186 pounds Temp:     98.4 degrees F oral Pulse rate:   68 / minute Pulse rhythm:   regular BP sitting:   150 / 90  (left arm) Cuff size:   large  Vitals Entered By: Mervin Hack CMA Duncan Dull) (September 28, 2010 8:03 AM) CC: 6 month follow-up   History of Present Illness: Having some mild sinus symptoms Pressure in right ear helped split and stack wood 3 days ago Some cough and PND  No recent problems with kidney stones has urology follow up  No chest pain No SOB  No stomach problems continues on PPI  Trying to be careful with eating  Allergies: 1)  ! Aspirin (Aspirin) 2)  ! Augmentin 3)  ! Codeine Sulfate (Codeine Sulfate) 4)  ! Benadryl (Diphenhydramine Hcl) 5)  ! Lodine 6)  ! Lisinopril (Lisinopril)  Past History:  Past medical, surgical, family and social histories (including risk factors) reviewed for relevance to current acute and chronic problems.  Past Medical History: Reviewed history from 09/26/2009 and no changes required. GERD Hypertension Hypothyroidism Hyperlipidemia Impaired fasting glucose Urinary incontinence--------------------------------Dr Dahlstedt  Dr Starr Sinclair  Past Surgical History: CT head neg. 12/20/1999 Sttress ECHO neg. 12/24/1999 Event monitor neg. 01/02/2000 Ureteroscopy-   caliceal diverticulum 2007  Lithotripsy 10/11  Family History: Reviewed history from 12/30/2008 and no changes required. Dad had MI, HTN, arthritis Mom had DM, HTN    Died of Altzheirmer's @84  (4?08) Lung cancer in [place] GF died of MI Granddaughter with IDDM   Social History: Reviewed history from 12/30/2008 and no changes required. 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 Has to shovel horse poop  every Saturday Weight down about 20# since June  Physical Exam  General:  alert and normal appearance.   Head:  no sinus tenderness Ears:  R ear normal and L ear normal.   Nose:  mild inflammation Mouth:  no erythema and no exudates.   Neck:  supple, no masses, no thyromegaly, no carotid bruits, and no cervical lymphadenopathy.   Lungs:  normal respiratory effort, no intercostal retractions, no accessory muscle use, and normal breath sounds.   Heart:  normal rate, regular rhythm, no murmur, and no gallop.   Abdomen:  soft and non-tender.   Extremities:  no edema Psych:  normally interactive, good eye contact, not anxious appearing, and not depressed appearing.     Impression & Recommendations:  Problem # 1:  SINUSITIS - ACUTE-NOS (ICD-461.9) Assessment New may be related to wood stove discussed humidifier if worsens, will use amoxil  Problem # 2:  HYPERTENSION (ICD-401.9) Assessment: Unchanged has had reasonalble control has lost weight no changes for now  Her updated medication list for this problem includes:    Tenormin 100 Mg Tabs (Atenolol) .Marland Kitchen... Take one by mouth daily  BP today: 150/90 Prior BP: 140/90 (06/15/2010)  Labs Reviewed: K+: 4.3 (06/15/2010) Creat: : 1.4 (06/15/2010)   Chol: 183 (03/27/2010)   HDL: 60.60 (03/27/2010)   LDL: 95 (03/27/2010)   TG: 135.0 (03/27/2010)  Problem # 3:  HYPERLIPIDEMIA (ICD-272.4) Assessment: Unchanged okay on med labs next time  Her updated medication list for this problem includes:    Lipitor 20 Mg Tabs (Atorvastatin calcium) .Marland KitchenMarland KitchenMarland KitchenMarland Kitchen  Take one by mouth daily  Labs Reviewed: SGOT: 30 (06/15/2010)   SGPT: 23 (06/15/2010)   HDL:60.60 (03/27/2010), 68.00 (03/24/2009)  LDL:95 (03/27/2010), 96 (03/24/2009)  Chol:183 (03/27/2010), 196 (03/24/2009)  Trig:135.0 (03/27/2010), 158.0 (03/24/2009)  Problem # 4:  GERD (ICD-530.81) Assessment: Unchanged fine on the med  Her updated medication list for this problem includes:     Omeprazole 20 Mg Cpdr (Omeprazole) .Marland Kitchen... 1 daily about 30 minutes before eating for acid reflux  Problem # 5:  IMPAIRED FASTING GLUCOSE (ICD-790.21) Assessment: Comment Only has lost weight will recheck at PE  Complete Medication List: 1)  Synthroid 100 Mcg Tabs (Levothyroxine sodium) .... Take one by mouth daily 2)  Tenormin 100 Mg Tabs (Atenolol) .... Take one by mouth daily 3)  Lipitor 20 Mg Tabs (Atorvastatin calcium) .... Take one by mouth daily 4)  Vesicare 5 Mg Tabs (Solifenacin succinate) .Marland Kitchen.. 1 tablet by mouth once daily 5)  Omeprazole 20 Mg Cpdr (Omeprazole) .Marland Kitchen.. 1 daily about 30 minutes before eating for acid reflux 6)  Amoxicillin 500 Mg Tabs (Amoxicillin) .... 2 tabs by mouth two times a day for sinus infection  Patient Instructions: 1)  Please schedule a follow-up appointment in 6 months for physical Prescriptions: AMOXICILLIN 500 MG TABS (AMOXICILLIN) 2 tabs by mouth two times a day for sinus infection  #50 x 0   Entered and Authorized by:   Cindee Salt MD   Signed by:   Cindee Salt MD on 09/28/2010   Method used:   Print then Give to Patient   RxID:   [id number] OMEPRAZOLE 20 MG CPDR (OMEPRAZOLE) 1 daily about 30 minutes before eating for acid reflux  #90 x 3   Entered by:   Mervin Hack CMA (AAMA)   Authorized by:   Cindee Salt MD   Signed by:   Mervin Hack CMA (AAMA) on 09/28/2010   Method used:   Print then Give to Patient   RxID:   [id number] VESICARE 5 MG TABS (SOLIFENACIN SUCCINATE) 1 tablet by mouth once daily  #90 x 3   Entered by:   Mervin Hack CMA (AAMA)   Authorized by:   Cindee Salt MD   Signed by:   Mervin Hack CMA (AAMA) on 09/28/2010   Method used:   Print then Give to Patient   RxID:   [id number] LIPITOR 20 MG  TABS (ATORVASTATIN CALCIUM) Take one by mouth daily  #90 x 3   Entered by:   Mervin Hack CMA (AAMA)   Authorized by:   Cindee Salt MD   Signed by:   Mervin Hack CMA  (AAMA) on 09/28/2010   Method used:   Print then Give to Patient   RxID:   [id number] TENORMIN 100 MG TABS (ATENOLOL) take one by mouth daily  #90 x 3   Entered by:   Mervin Hack CMA (AAMA)   Authorized by:   Cindee Salt MD   Signed by:   Mervin Hack CMA (AAMA) on 09/28/2010   Method used:   Print then Give to Patient   RxID:   [id number] SYNTHROID 100 MCG TABS (LEVOTHYROXINE SODIUM) Take one by mouth daily  #90 x 3   Entered by:   Mervin Hack CMA (AAMA)   Authorized by:   Cindee Salt MD   Signed by:   Mervin Hack CMA (AAMA) on 09/28/2010   Method used:   Print then Give to Patient   RxID:   [id number]  Orders Added: 1)  Est. Patient Level IV [01027]    Current Allergies (reviewed today): ! ASPIRIN (ASPIRIN) ! AUGMENTIN ! CODEINE SULFATE (CODEINE SULFATE) ! BENADRYL (DIPHENHYDRAMINE HCL) ! LODINE ! LISINOPRIL (LISINOPRIL)

## 2010-11-20 NOTE — Progress Notes (Signed)
Summary: prior auth needed for omeprazole  Phone Note From Pharmacy   Caller: [facility] Summary of Call: Prior Berkley Harvey is needed for omeprazole, form is on your desk. Initial call taken by: Lowella Petties CMA,  March 30, 2010 8:24 AM  Follow-up for Phone Call        form done Follow-up by: Cindee Salt MD,  March 30, 2010 1:48 PM  Additional Follow-up for Phone Call Additional follow up Details #1::        form faxed back to [facility] Additional Follow-up by: Mervin Hack CMA Duncan Dull),  March 30, 2010 3:19 PM

## 2010-11-20 NOTE — Progress Notes (Signed)
Summary: still has sinus sxs  Phone Note Call from Patient Call back at [phone]   Caller: Patient Call For: Cindee Salt MD Summary of Call: Pt was seen on 6/7 and given abx for sinus infection and UTI.  The UTI has cleared up but she is still having sinus sxs.  Right ear is stopped up. She is asking if she can get a refill on the abx.  [facility]. Initial call taken by: Lowella Petties CMA,  April 06, 2010 8:04 AM  Follow-up for Phone Call        okay to refill the cefuroxime for another 10 days needs reeval if not better after that Follow-up by: Cindee Salt MD,  April 06, 2010 9:08 AM  Additional Follow-up for Phone Call Additional follow up Details #1::        Patient Advised.  Additional Follow-up by: Delilah Shan CMA (AAMA),  April 06, 2010 11:13 AM    Prescriptions: CEFUROXIME AXETIL 250 MG TABS (CEFUROXIME AXETIL) 1 by mouth two times a day for urinary infection  #20 x 0   Entered by:   Delilah Shan CMA (AAMA)   Authorized by:   Cindee Salt MD   Signed by:   Delilah Shan CMA (AAMA) on 04/06/2010   Method used:   Electronically to        [facility]* (retail)       [address]       [place], [place]  [address]       Ph: [phone]       Fax: [phone]   RxID:   [id number]

## 2010-11-20 NOTE — Letter (Signed)
[facility]: [facility]   Imported By: Sherian Rein 07/07/2010 09:21:27  _____________________________________________________________________  External Attachment:    Type:   Image     Comment:   External Document  Appended Document: [facility] right urethral stent removed

## 2010-11-22 NOTE — Assessment & Plan Note (Signed)
Summary: ?RASH/CLE   Vital Signs:  Patient profile:   65 year old female Weight:      211 pounds Temp:     98.2 degrees F oral BP sitting:   128 / 90  (left arm) Cuff size:   large  Vitals Entered By: Mervin Hack CMA Duncan Dull) (October 08, 2010 12:36 PM) CC: rash   History of Present Illness: Ddi start the amoxicillin 3 days ago Noted hives all over yesterday tried baby powder and other topical Rx  still some itching seems to have mostly resolved  No mouth swelling but throat doesn't feel right able to swallow but she can feel it  No fever no wheezing or SOB  Allergies: 1)  ! Aspirin (Aspirin) 2)  ! Augmentin 3)  ! Codeine Sulfate (Codeine Sulfate) 4)  ! Benadryl (Diphenhydramine Hcl) 5)  ! Lodine 6)  ! Lisinopril (Lisinopril) 7)  ! Amoxicillin (Amoxicillin)  Past History:  Past medical, surgical, family and social histories (including risk factors) reviewed for relevance to current acute and chronic problems.  Past Medical History: Reviewed history from 09/26/2009 and no changes required. GERD Hypertension Hypothyroidism Hyperlipidemia Impaired fasting glucose Urinary incontinence--------------------------------Dr Dahlstedt  Dr Starr Sinclair  Past Surgical History: Reviewed history from 09/28/2010 and no changes required. CT head neg. 12/20/1999 Sttress ECHO neg. 12/24/1999 Event monitor neg. 01/02/2000 Ureteroscopy-   caliceal diverticulum 2007  Lithotripsy 10/11  Family History: Reviewed history from 12/30/2008 and no changes required. Dad had MI, HTN, arthritis Mom had DM, HTN    Died of Altzheirmer's @84  (4?08) Lung cancer in [place] GF died of MI Granddaughter with IDDM   Social History: Reviewed history from 12/30/2008 and no changes required. 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  NAD Mouth:  no oral lesions or swelling Lungs:  normal respiratory effort, no intercostal retractions, no accessory muscle use, normal breath sounds, and no wheezes.   Skin:  very slight red areas along left arm not specific   Impression & Recommendations:  Problem # 1:  ADVERSE DRUG REACTION (ICD-995.20) Assessment New not clearly hives but could be will put amoxil on her allergy list  sinuses seem better--will hold off on another med she prefers no systemic Rx since she is already improved  Complete Medication List: 1)  Synthroid 100 Mcg Tabs (Levothyroxine sodium) .... Take one by mouth daily 2)  Tenormin 100 Mg Tabs (Atenolol) .... Take one by mouth daily 3)  Lipitor 20 Mg Tabs (Atorvastatin calcium) .... Take one by mouth daily 4)  Vesicare 5 Mg Tabs (Solifenacin succinate) .Marland Kitchen.. 1 tablet by mouth once daily 5)  Omeprazole 20 Mg Cpdr (Omeprazole) .Marland Kitchen.. 1 daily about 30 minutes before eating for acid reflux 6)  Amoxicillin 500 Mg Tabs (Amoxicillin) .... 2 tabs by mouth two times a day for sinus infection  Patient Instructions: 1)  Please try loratadine 10mg  1-2 daily or cetirizine 10mg  daily as needed for persistent itching or hives 2)  Please keep your regular appointment   Orders Added: 1)  Est. Patient Level III [16109]    Current Allergies (reviewed today): ! ASPIRIN (ASPIRIN) ! AUGMENTIN ! CODEINE SULFATE (CODEINE SULFATE) ! BENADRYL (DIPHENHYDRAMINE HCL) ! LODINE ! LISINOPRIL (LISINOPRIL) ! AMOXICILLIN (AMOXICILLIN)

## 2010-11-22 NOTE — Letter (Signed)
Summary: [facility]   Imported By: Lester Newton Hamilton 10/23/2010 10:52:01  _____________________________________________________________________  External Attachment:    Type:   Image     Comment:   External Document  Appended Document: [facility] no stones on KUB continues on vesicare

## 2011-01-03 LAB — BASIC METABOLIC PANEL
BUN: 20 mg/dL (ref 6–23)
CO2: 22 mEq/L (ref 19–32)
Chloride: 105 mEq/L (ref 96–112)
GFR calc Af Amer: 35 mL/min — ABNORMAL LOW (ref >60)
GFR calc non Af Amer: 29 mL/min — ABNORMAL LOW (ref >60)
Glucose, Bld: 181 mg/dL — ABNORMAL HIGH (ref 70–99)
Potassium: 4.1 mEq/L (ref 3.5–5.1)

## 2011-01-03 LAB — URINALYSIS, ROUTINE W REFLEX MICROSCOPIC
Glucose, UA: NEGATIVE mg/dL
Nitrite: POSITIVE — AB
Protein, ur: 30 mg/dL — AB
Specific Gravity, Urine: 1.031 — ABNORMAL HIGH (ref 1.005–1.030)
Urobilinogen, UA: 1 mg/dL (ref 0.0–1.0)
pH: 5 (ref 5.0–8.0)

## 2011-01-03 LAB — URINE MICROSCOPIC-ADD ON

## 2011-01-03 LAB — URINE CULTURE
Colony Count: 65000
Culture  Setup Time: 201108270545

## 2011-01-03 LAB — DIFFERENTIAL
Basophils Absolute: 0 10*3/uL (ref 0.0–0.1)
Basophils Relative: 0 % (ref 0–1)
Eosinophils Relative: 0 % (ref 0–5)
Neutro Abs: 24.1 10*3/uL — ABNORMAL HIGH (ref 1.7–7.7)
Neutrophils Relative %: 92 % — ABNORMAL HIGH (ref 43–77)

## 2011-01-03 LAB — CULTURE, BLOOD (ROUTINE X 2): Culture: NO GROWTH

## 2011-01-03 LAB — CBC
HCT: 38.3 % (ref 36.0–46.0)
MCH: 30.6 pg (ref 26.0–34.0)
MCHC: 34.6 g/dL (ref 30.0–36.0)
MCV: 88.2 fL (ref 78.0–100.0)
RBC: 4.34 MIL/uL (ref 3.87–5.11)

## 2011-01-07 LAB — DIFFERENTIAL
Eosinophils Absolute: 0.1 10*3/uL (ref 0.0–0.7)
Eosinophils Relative: 1 % (ref 0–5)
Lymphs Abs: 1.6 10*3/uL (ref 0.7–4.0)
Monocytes Absolute: 0.7 10*3/uL (ref 0.1–1.0)
Monocytes Relative: 10 % (ref 3–12)

## 2011-01-07 LAB — BASIC METABOLIC PANEL
BUN: 18 mg/dL (ref 6–23)
Chloride: 108 mEq/L (ref 96–112)
Potassium: 4.5 mEq/L (ref 3.5–5.1)

## 2011-01-07 LAB — CBC
HCT: 38 % (ref 36.0–46.0)
Hemoglobin: 13.3 g/dL (ref 12.0–15.0)
MCV: 89.2 fL (ref 78.0–100.0)
RBC: 4.26 MIL/uL (ref 3.87–5.11)
WBC: 7.5 10*3/uL (ref 4.0–10.5)

## 2011-01-07 LAB — URINE CULTURE

## 2011-01-07 LAB — URINE MICROSCOPIC-ADD ON

## 2011-01-07 LAB — URINALYSIS, ROUTINE W REFLEX MICROSCOPIC
Bilirubin Urine: NEGATIVE
Glucose, UA: NEGATIVE mg/dL
Ketones, ur: NEGATIVE mg/dL
pH: 8.5 — ABNORMAL HIGH (ref 5.0–8.0)

## 2011-02-22 ENCOUNTER — Telehealth: Payer: Self-pay | Admitting: *Deleted

## 2011-02-22 NOTE — Telephone Encounter (Signed)
Prior auth given for omeprazole, LMOM advising pt, form placed on doctor's desk for signature and scanning.

## 2011-02-22 NOTE — Telephone Encounter (Signed)
Form faxed to [facility] at [phone] and given to [place].

## 2011-02-22 NOTE — Telephone Encounter (Signed)
Prior Sherry Harris is needed for omeprazole.  There is one in place but it will expire on 6/11. Form is on your desk.

## 2011-02-22 NOTE — Telephone Encounter (Signed)
Left message for mom and grandmother Need to call back (would need visit before I can treat despite past episodes)  Can see this afternoon if we can reach her

## 2011-02-22 NOTE — Telephone Encounter (Signed)
Form done. Please send.

## 2011-03-08 NOTE — Op Note (Signed)
NAMEERCELL, PERLMAN            ACCOUNT NO.:  [id number]   MEDICAL RECORD NO.:  [id number]          PATIENT TYPE:  AMB   LOCATION:  [facility]                         FACILITY:  [facility]   PHYSICIAN:  Bertram Millard. Dahlstedt, M.D.DATE OF BIRTH:  [birth year]   DATE OF PROCEDURE:  06/06/2006  DATE OF DISCHARGE:                                 OPERATIVE REPORT   PREOPERATIVE DIAGNOSIS:  Right renal and ureteral calculi.   POSTOPERATIVE DIAGNOSIS:  Right renal stone, within the parenchyma, no  evidence of right ureteral calculus.   PRINCIPAL PROCEDURE:  Cystoscopy, right retrograde ureteropyelogram,  diagnostic right ureteroscopy.   SURGEON:  Bertram Millard. Dahlstedt, M.D.   ANESTHETIC:  General.   COMPLICATIONS:  None.   ESTIMATED BLOOD LOSS:  None.   SPECIMENS:  None.   BRIEF HISTORY:  A 65 year old female who had been seen in the office for 2-3  months.  She has had persistent right flank pain, some nausea and vomiting.  CT urogram recently revealed a right ureteral stone.  Additionally, she has  had a known right renal stone.  The ureteral stone has been hard to follow  with KUBs.   As the patient has had persistent pain over the past to 2-3 months, she  requested treatment.  She presents at this time for ureteroscopy.  As the  ureteral stone as not easily seen, it was recommended that she have for  ureteroscopy over a lithotripsy.  She is aware of risks and complications  and desires to proceed.   DESCRIPTION OF PROCEDURE:  The patient was identified in the holding area,  given IV antibiotics preoperatively and taken to the operating room, where  general anesthetic was administered.  She was placed in the dorsal lithotomy  position.  Genitalia and perineum were prepped and draped.  A 22-French  panendoscope was advanced into her bladder.  The bladder appeared normal.  No lesions, no trabeculations noted.  Both ureteral orifices were normal in  configuration and location.  The right  ureter was catheterized and a  retrograde performed.  This showed no evident filling defects along the  course of the ureter.  There was no hydronephrosis of the right kidney.  No  filling defects were seen in the pyelocaliceal systems, which appeared  normal.  The evident calcification over the left lower pole did not seem to  be close to the calix and the lower pole.  At this point I, over top of the  guidewire, advanced a 35 cm ureteral access sheath.  This was advanced under  fluoroscopic vision to the renal pelvis.  The guidewire and the inner core  were removed.  A 6-French flexible ureteroscope was advanced through the  sheath into the area of the right UPJ.  The pelvis was identified, inspected  and found to be normal.  No stones were seen.  I first inspected the upper  and midpole calyces.  These were normal with no evident calcifications  (there were some very tiny calcifications on the papillae but no large  stones).  The scope was advanced, using fluoroscopic guidance, into both  lower pole calyces.  I did not see, upon repeated inspection, any evident  stones in the lower pole.  When the scope was advanced to the edge of the  papillae of the lower pole, the scope was still approximately a centimeter  away from the stone that was seen using fluoroscopy.  It was felt to the  stone was probably a small caliceal diverticulum.  After inspecting all  lower pole calyces for approximately 20 minutes, and not identifying the  stone, I then completed the procedure.  The scope was advanced retrograde  under direct vision.  No ureteral abnormalities were seen.  The bladder was  then drained.  The procedure was terminated.   The patient was awakened, taken to PACU, in stable condition.   She will be discharged on Demerol 25 mg one to two p.o. q.4h. p.r.n. pain as  well as Keflex 500 mg one p.o. b.i.d. for 5 days.   She will follow up at 1-2 weeks.      Bertram Millard. Dahlstedt, M.D.   Electronically Signed     SMD/MEDQ  D:  06/06/2006  T:  06/06/2006  Job:  [id number]   cc:   Rosalyn Gess. Norins, MD  [address]  [place]  [place] [address]

## 2011-03-08 NOTE — Op Note (Signed)
[name], [name]                      ACCOUNT NO.:  [id number]   MEDICAL RECORD NO.:  [id number]                   PATIENT TYPE:  AMB   LOCATION:  [facility]                                  FACILITY:  [facility]   PHYSICIAN:  Christopher E. Ezzard Standing, M.D.         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   OPERATION PERFORMED:  Excision of right mandibular alveolar mass with  alveoloplasty.   SURGEON:  Kristine Garbe. Ezzard Standing, M.D.   ANESTHESIA:  General endotracheal.   COMPLICATIONS:  None.   INDICATIONS FOR PROCEDURE:  Sherry Harris is a 65 year old female who has  developed a mass along the right mandible over the last couple of months.  On intraoral examination, she has a polypoid hemangiomatous or vascular  lesion that arises from the right side of the mandible and extends along  approximately 2.5 to 3 cm of mandible and about 1.5 cm wide.  She was taken  to the operating room at this time for excisional biopsy of the right  vascular mandibular mass.   DESCRIPTION OF PROCEDURE:  After general endotracheal anesthesia, the area  was injected with Xylocaine with epinephrine for hemostasis.  Then using 15  scalpel, the clear edge of the mucosa adjacent to the mass was excised down  to the mandibular bone, then using a periosteal elevator, the mass was  elevated off of the mandible and the lingual side was cut with the scissors  as it extended into the tongue region.  Hemostasis was obtained with the  cautery.  After excising the soft tissue mass, there was some slight  irregularity of the bone and using dental cutting bur, the edge of the  mandible was drilled down to smooth bone.  The bone appeared healthy.  Cautery was used to obtain hemostasis on the  lingual side of the excision.  The defect over the mandible was partially closed posteriorly with a couple  of interrupted 4-0 Vicryl sutures.  After obtaining adequate hemostasis, the  procedure was completed.  Sherry Harris was awakened from anesthesia and transferred  to recovery postoperatively doing well.   DISPOSITION:  Sherry Harris is discharged to home later this morning on Keflex 500  mg twice daily for a week, Tylenol,  Darvocet-N 100 one to two every four  hours as needed for pain.  Instructed to rinse her mouth out frequently with  water or saline.  Will have her follow up in my office in four days to  review pathology and recheck excision site.  Kristine Garbe. Ezzard Standing, M.D.    CEN/MEDQ  D:  11/18/2003  T:  11/18/2003  Job:  [id number]

## 2011-03-30 ENCOUNTER — Encounter: Payer: Self-pay | Admitting: Internal Medicine

## 2011-04-01 ENCOUNTER — Encounter: Payer: Self-pay | Admitting: Internal Medicine

## 2011-04-01 ENCOUNTER — Ambulatory Visit (INDEPENDENT_AMBULATORY_CARE_PROVIDER_SITE_OTHER): Payer: Managed Care, Other (non HMO) | Admitting: Internal Medicine

## 2011-04-01 DIAGNOSIS — I10 Essential (primary) hypertension: Secondary | ICD-10-CM

## 2011-04-01 DIAGNOSIS — R7301 Impaired fasting glucose: Secondary | ICD-10-CM

## 2011-04-01 DIAGNOSIS — Z0000 Encounter for general adult medical examination without abnormal findings: Secondary | ICD-10-CM | POA: Insufficient documentation

## 2011-04-01 DIAGNOSIS — Z2911 Encounter for prophylactic immunotherapy for respiratory syncytial virus (RSV): Secondary | ICD-10-CM

## 2011-04-01 DIAGNOSIS — E785 Hyperlipidemia, unspecified: Secondary | ICD-10-CM

## 2011-04-01 DIAGNOSIS — E039 Hypothyroidism, unspecified: Secondary | ICD-10-CM

## 2011-04-01 NOTE — Assessment & Plan Note (Signed)
BP Readings from Last 3 Encounters:  04/01/11 138/82  10/08/10 128/90  09/28/10 150/90   Fairly good control Due for labs No changes

## 2011-04-01 NOTE — Assessment & Plan Note (Signed)
Healthy but out of shape Discussed fitness Will give zostavax Will see gyn at least once more Not due for colon

## 2011-04-01 NOTE — Assessment & Plan Note (Signed)
Lab Results  Component Value Date   TSH 1.54 03/27/2010   Clinically euthyroid  Recheck labs

## 2011-04-01 NOTE — Assessment & Plan Note (Signed)
Lab Results  Component Value Date   LDLCALC 95 03/27/2010   No problems with statin Due for labs

## 2011-04-01 NOTE — Progress Notes (Signed)
Subjective:    Patient ID: Sherry Harris, female    DOB: 23-Oct-1945, 65 y.o.   MRN: [id number]  HPI Doing okay Some concerns about her ears--thinks she got some water in there in shower. Has feeling of congestion and hearing off Has had allergy symptoms and uses OTC antihistamines--these help (loratadine)  Still sees Dr Elana Alm Last pap about 2 years ago Gets breast exam from him  Has been eating too much cake lately Still works long hours and doesn't do exercise Has tried to do some walking Has tried slim fast to lose some weight--has lost a little just lately  Current Outpatient Prescriptions on File Prior to Visit  Medication Sig Dispense Refill  . atenolol (TENORMIN) 100 MG tablet Take 100 mg by mouth daily.        Marland Kitchen atorvastatin (LIPITOR) 20 MG tablet Take 20 mg by mouth daily.        Marland Kitchen levothyroxine (SYNTHROID, LEVOTHROID) 100 MCG tablet Take 100 mcg by mouth daily.        Marland Kitchen omeprazole (PRILOSEC) 20 MG capsule Take 20 mg by mouth daily.        Marland Kitchen DISCONTD: solifenacin (VESICARE) 5 MG tablet Take 5 mg by mouth daily.         Past Medical History  Diagnosis Date  . GERD (gastroesophageal reflux disease)   . Hypertension   . Thyroid disease   . Hyperlipidemia   . Impaired fasting glucose   . Urinary incontinence     Past Surgical History  Procedure Date  . Ureteroscopy 2007    caliceal diverticulum  . Lithotripsy 10/11    Family History  Problem Relation Age of Onset  . Alzheimer's disease Mother   . Hypertension Mother   . Diabetes Mother   . Arthritis Father   . Diabetes Father   . Hypertension Father     History   Social History  . Marital Status: Widowed    Spouse Name: N/A    Number of Children: 1  . Years of Education: N/A   Occupational History  . [employer]   Social History Main Topics  . Smoking status: Former Games developer  . Smokeless tobacco: Never Used  . Alcohol Use: No  . Drug Use: No  . Sexually Active: Not on file    Other Topics Concern  . Not on file   Social History Narrative  . No narrative on file   Review of Systems  Constitutional: Negative for fatigue and unexpected weight change.       Wears seat belt  HENT: Positive for hearing loss, congestion and rhinorrhea. Negative for tinnitus.        Left ear feels stopped up Regular with dentist  Eyes: Negative for visual disturbance.       No diplopia or focal vision loss Dr Lorin Picket following early cataracts  Respiratory: Positive for shortness of breath. Negative for cough and chest tightness.        Stable DOE with yard work  Cardiovascular: Negative for chest pain, palpitations and leg swelling.  Gastrointestinal: Negative for nausea, vomiting, constipation and blood in stool.       No sig heartburn problems  Genitourinary: Negative for dysuria, frequency and difficulty urinating.       No sex -no problem No recurrence of kidney stones  Musculoskeletal: Positive for back pain and arthralgias. Negative for joint swelling.       Right 2nd and occ 3rd finger pain  Skin: Positive  for rash.       No suspicious lesions Had some rash on back after using icy hot and heating pad  Neurological: Negative for dizziness, syncope, weakness, light-headedness, numbness and headaches.  Hematological: Negative for adenopathy. Does not bruise/bleed easily.  Psychiatric/Behavioral: Negative for sleep disturbance and dysphoric mood. The patient is not nervous/anxious.        Objective:   Physical Exam  Constitutional: She is oriented to person, place, and time. She appears well-developed and well-nourished. No distress.  HENT:  Head: Normocephalic and atraumatic.  Right Ear: External ear normal.  Left Ear: External ear normal.  Mouth/Throat: Oropharynx is clear and moist. No oropharyngeal exudate.       TMs normal  Eyes: Conjunctivae and EOM are normal. Pupils are equal, round, and reactive to light.       Fundi benign  Neck: Normal range of motion.  Neck supple. No thyromegaly present.       No carotid bruits  Cardiovascular: Normal rate, regular rhythm, normal heart sounds and intact distal pulses.  Exam reveals no gallop.   No murmur heard. Pulmonary/Chest: Effort normal and breath sounds normal. No respiratory distress. She has no wheezes. She has no rales.  Abdominal: Soft. She exhibits no mass. There is no tenderness.  Musculoskeletal: Normal range of motion. She exhibits no edema and no tenderness.  Lymphadenopathy:    She has no cervical adenopathy.  Neurological: She is alert and oriented to person, place, and time. She exhibits normal muscle tone.       Normal strength and gait  Skin: Skin is warm. No rash noted.  Psychiatric: She has a normal mood and affect. Her behavior is normal. Judgment and thought content normal.          Assessment & Plan:

## 2011-04-02 LAB — LDL CHOLESTEROL, DIRECT: Direct LDL: 113 mg/dL

## 2011-04-02 LAB — BASIC METABOLIC PANEL
BUN: 13 mg/dL (ref 6–23)
CO2: 29 mEq/L (ref 19–32)
Chloride: 105 mEq/L (ref 96–112)
Creatinine, Ser: 1 mg/dL (ref 0.4–1.2)
Potassium: 4.2 mEq/L (ref 3.5–5.1)

## 2011-04-02 LAB — LIPID PANEL: Cholesterol: 212 mg/dL — ABNORMAL HIGH (ref 0–200)

## 2011-04-02 LAB — HEPATIC FUNCTION PANEL
ALT: 19 U/L (ref 0–35)
AST: 19 U/L (ref 0–37)
Albumin: 4.2 g/dL (ref 3.5–5.2)
Alkaline Phosphatase: 87 U/L (ref 39–117)
Total Bilirubin: 0.5 mg/dL (ref 0.3–1.2)

## 2011-04-02 LAB — HEMOGLOBIN A1C: Hgb A1c MFr Bld: 6.4 % (ref 4.6–6.5)

## 2011-04-02 LAB — CBC WITH DIFFERENTIAL/PLATELET
Basophils Absolute: 0 10*3/uL (ref 0.0–0.1)
Eosinophils Absolute: 0.2 10*3/uL (ref 0.0–0.7)
HCT: 38.4 % (ref 36.0–46.0)
Lymphs Abs: 2 10*3/uL (ref 0.7–4.0)
MCHC: 33.7 g/dL (ref 30.0–36.0)
Monocytes Relative: 10.2 % (ref 3.0–12.0)
Neutro Abs: 3.7 10*3/uL (ref 1.4–7.7)
Platelets: 149 10*3/uL — ABNORMAL LOW (ref 150.0–400.0)
RDW: 13 % (ref 11.5–14.6)

## 2011-04-02 LAB — T4, FREE: Free T4: 0.83 ng/dL (ref 0.60–1.60)

## 2011-06-26 ENCOUNTER — Other Ambulatory Visit: Payer: Self-pay | Admitting: Internal Medicine

## 2011-06-26 DIAGNOSIS — Z1231 Encounter for screening mammogram for malignant neoplasm of breast: Secondary | ICD-10-CM

## 2011-07-29 ENCOUNTER — Ambulatory Visit
Admission: RE | Admit: 2011-07-29 | Discharge: 2011-07-29 | Disposition: A | Discharge status: Home / Self Care | Payer: Managed Care, Other (non HMO) | Source: Ambulatory Visit | Attending: Internal Medicine | Admitting: Internal Medicine

## 2011-07-29 DIAGNOSIS — Z1231 Encounter for screening mammogram for malignant neoplasm of breast: Secondary | ICD-10-CM

## 2011-07-31 ENCOUNTER — Encounter: Payer: Self-pay | Admitting: *Deleted

## 2011-08-21 ENCOUNTER — Encounter: Payer: Self-pay | Admitting: Family Medicine

## 2011-08-21 ENCOUNTER — Ambulatory Visit (INDEPENDENT_AMBULATORY_CARE_PROVIDER_SITE_OTHER): Payer: Managed Care, Other (non HMO) | Admitting: Family Medicine

## 2011-08-21 VITALS — BP 140/82 | HR 74 | Temp 98.0°F | Wt 208.8 lb

## 2011-08-21 DIAGNOSIS — J069 Acute upper respiratory infection, unspecified: Secondary | ICD-10-CM

## 2011-08-21 MED ORDER — BENZONATATE 100 MG PO CAPS: 100.0000 mg | ORAL_CAPSULE | Freq: Four times a day (QID) | ORAL | Status: DC | PRN

## 2011-08-21 NOTE — Patient Instructions (Signed)
Nice to meet you.  I hope you feel better soon. Drink lots of fluids.  Treat sympotmatically with Mucinex, nasal saline irrigation, and Tylenol/Ibuprofen. Cough suppressant (Tessalon) at night. Call if not improving as expected in 5-7 days.

## 2011-08-21 NOTE — Progress Notes (Signed)
SUBJECTIVE:  Sherry Harris is a 65 y.o. female who complains of coryza, congestion, sneezing and dry cough for 5 days. She denies a history of anorexia, chest pain, chills, dizziness, fatigue, fevers, myalgias and shortness of breath and denies a history of asthma. Patient denies smoke cigarettes.  Taking Alkaseltzer cold medicine with mild relief of symptoms.   Patient Active Problem List  Diagnoses  . HYPOTHYROIDISM  . HYPERLIPIDEMIA  . HYPERTENSION  . GERD  . RENAL CALCULUS  . URINARY INCONTINENCE  . IMPAIRED FASTING GLUCOSE  . Routine general medical examination at a health care facility   Past Medical History  Diagnosis Date  . GERD (gastroesophageal reflux disease)   . Hypertension   . Thyroid disease   . Hyperlipidemia   . Impaired fasting glucose   . Urinary incontinence    Past Surgical History  Procedure Date  . Ureteroscopy 2007    caliceal diverticulum  . Lithotripsy 10/11   History  Substance Use Topics  . Smoking status: Former Games developer  . Smokeless tobacco: Never Used  . Alcohol Use: No   Family History  Problem Relation Age of Onset  . Alzheimer's disease Mother   . Hypertension Mother   . Diabetes Mother   . Arthritis Father   . Diabetes Father   . Hypertension Father    Allergies  Allergen Reactions  . Amoxicillin     REACTION: rash----???hives  . Aspirin     REACTION: Hives  . Codeine Sulfate     REACTION: Tachycardia  . Diphenhydramine Hcl     REACTION: Hives  . Etodolac     REACTION: Heart flutter  . ZOX:WRUEAVWUJWJ+XBJYNWGNF+AOZHYQMVHQ Acid+Aspartame     REACTION: Rash  . Lisinopril     REACTION: Headache, cough tat   Current Outpatient Prescriptions on File Prior to Visit  Medication Sig Dispense Refill  . atenolol (TENORMIN) 100 MG tablet Take 100 mg by mouth daily.        Marland Kitchen atorvastatin (LIPITOR) 20 MG tablet Take 20 mg by mouth daily.        Marland Kitchen levothyroxine (SYNTHROID, LEVOTHROID) 100 MCG tablet Take 100 mcg by mouth  daily.        Marland Kitchen omeprazole (PRILOSEC) 20 MG capsule Take 20 mg by mouth daily.         The PMH, PSH, Social History, Family History, Medications, and allergies have been reviewed in [facility], and have been updated if relevant.  OBJECTIVE: BP 140/82  Pulse 74  Temp(Src) 98 F (36.7 C) (Oral)  Wt 208 lb 12 oz (94.688 kg)  She appears well, vital signs are as noted. Ears normal.  Throat and pharynx normal.  Neck supple. No adenopathy in the neck. Nose is congested. Sinuses non tender. The chest is clear, without wheezes or rales.  ASSESSMENT:  viral upper respiratory illness  PLAN: Symptomatic therapy suggested: push fluids, rest and return office visit prn if symptoms persist or worsen. Lack of antibiotic effectiveness discussed with her. Call or return to clinic prn if these symptoms worsen or fail to improve as anticipated.

## 2011-10-08 ENCOUNTER — Encounter: Payer: Self-pay | Admitting: Internal Medicine

## 2011-10-08 ENCOUNTER — Ambulatory Visit (INDEPENDENT_AMBULATORY_CARE_PROVIDER_SITE_OTHER): Payer: Managed Care, Other (non HMO) | Admitting: Internal Medicine

## 2011-10-08 VITALS — BP 150/90 | HR 69 | Temp 97.5°F | Ht 67.0 in | Wt 208.0 lb

## 2011-10-08 DIAGNOSIS — E785 Hyperlipidemia, unspecified: Secondary | ICD-10-CM

## 2011-10-08 DIAGNOSIS — Z23 Encounter for immunization: Secondary | ICD-10-CM

## 2011-10-08 DIAGNOSIS — E039 Hypothyroidism, unspecified: Secondary | ICD-10-CM

## 2011-10-08 DIAGNOSIS — I10 Essential (primary) hypertension: Secondary | ICD-10-CM

## 2011-10-08 DIAGNOSIS — K219 Gastro-esophageal reflux disease without esophagitis: Secondary | ICD-10-CM

## 2011-10-08 MED ORDER — OMEPRAZOLE 20 MG PO CPDR: 20.0000 mg | DELAYED_RELEASE_CAPSULE | Freq: Every day | ORAL | Status: DC

## 2011-10-08 MED ORDER — ATORVASTATIN CALCIUM 20 MG PO TABS: 20.0000 mg | ORAL_TABLET | Freq: Every day | ORAL | Status: DC

## 2011-10-08 MED ORDER — ATENOLOL 100 MG PO TABS: 100.0000 mg | ORAL_TABLET | Freq: Every day | ORAL | Status: DC

## 2011-10-08 MED ORDER — LEVOTHYROXINE SODIUM 100 MCG PO TABS: 100.0000 ug | ORAL_TABLET | Freq: Every day | ORAL | Status: AC

## 2011-10-08 NOTE — Assessment & Plan Note (Signed)
Symptoms controlled with med

## 2011-10-08 NOTE — Progress Notes (Signed)
Subjective:    Patient ID: Sherry Harris, female    DOB: 01/29/1946, 65 y.o.   MRN: [id number]  HPI Doing okay No new concerns  Has had some cough lately from 6 weeks ago Not sick anymore Some rhinorrhea No fever No SOB  No chest pain No palpitations Has started a wellness program at work  Did have a pap smear about 3 years ago--or more Discussed that I can do it at next PE  Stomach is fine No sig heartburn on the medication  Lab Results  Component Value Date   LDLCALC 95 03/27/2010  doing fine on the chol med    Current Outpatient Prescriptions on File Prior to Visit  Medication Sig Dispense Refill  . atenolol (TENORMIN) 100 MG tablet Take 100 mg by mouth daily.        Marland Kitchen atorvastatin (LIPITOR) 20 MG tablet Take 20 mg by mouth daily.        Marland Kitchen levothyroxine (SYNTHROID, LEVOTHROID) 100 MCG tablet Take 100 mcg by mouth daily.        Marland Kitchen omeprazole (PRILOSEC) 20 MG capsule Take 20 mg by mouth daily.          Allergies  Allergen Reactions  . Amoxicillin     REACTION: rash----???hives  . Aspirin     REACTION: Hives  . Codeine Sulfate     REACTION: Tachycardia  . Diphenhydramine Hcl     REACTION: Hives  . Etodolac     REACTION: Heart flutter  . WUJ:WJXBJYNWGNF+AOZHYQMVH+QIONGEXBMW Acid+Aspartame     REACTION: Rash  . Lisinopril     REACTION: Headache, cough tat    Past Medical History  Diagnosis Date  . GERD (gastroesophageal reflux disease)   . Hypertension   . Thyroid disease   . Hyperlipidemia   . Impaired fasting glucose   . Urinary incontinence     Past Surgical History  Procedure Date  . Ureteroscopy 2007    caliceal diverticulum  . Lithotripsy 10/11    Family History  Problem Relation Age of Onset  . Alzheimer's disease Mother   . Hypertension Mother   . Diabetes Mother   . Arthritis Father   . Diabetes Father   . Hypertension Father     History   Social History  . Marital Status: Widowed    Spouse Name: N/A    Number of Children:  1  . Years of Education: N/A   Occupational History  . [employer]   Social History Main Topics  . Smoking status: Former Games developer  . Smokeless tobacco: Never Used  . Alcohol Use: No  . Drug Use: No  . Sexually Active: Not on file   Other Topics Concern  . Not on file   Social History Narrative  . No narrative on file   Review of Systems Weight is stable Sleeps okay most of the time No urinary problems--off the med    Objective:   Physical Exam  Constitutional: She appears well-developed and well-nourished. No distress.  Neck: Normal range of motion. Neck supple. No thyromegaly present.  Cardiovascular: Normal rate, regular rhythm, normal heart sounds and intact distal pulses.  Exam reveals no gallop.   No murmur heard. Pulmonary/Chest: Effort normal and breath sounds normal. No respiratory distress. She has no wheezes. She has no rales.  Abdominal: Soft. There is no tenderness.  Musculoskeletal: She exhibits no edema and no tenderness.  Lymphadenopathy:    She has no cervical adenopathy.  Psychiatric: She has a  normal mood and affect. Her behavior is normal. Judgment and thought content normal.          Assessment & Plan:
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
## 2011-10-08 NOTE — Assessment & Plan Note (Signed)
Doing fine on the med At goal

## 2011-10-08 NOTE — Assessment & Plan Note (Signed)
Euthyroid Will recheck next time

## 2012-01-02 ENCOUNTER — Other Ambulatory Visit: Payer: Self-pay | Admitting: *Deleted

## 2012-01-02 MED ORDER — ATORVASTATIN CALCIUM 20 MG PO TABS: 20.0000 mg | ORAL_TABLET | Freq: Every day | ORAL | Status: DC

## 2012-01-06 ENCOUNTER — Other Ambulatory Visit: Payer: Self-pay | Admitting: *Deleted

## 2012-01-06 MED ORDER — ATENOLOL 100 MG PO TABS: 100.0000 mg | ORAL_TABLET | Freq: Every day | ORAL | Status: AC

## 2012-01-06 MED ORDER — ATORVASTATIN CALCIUM 20 MG PO TABS: 20.0000 mg | ORAL_TABLET | Freq: Every day | ORAL | Status: AC

## 2012-01-06 NOTE — Telephone Encounter (Signed)
rx sent to pharmacy by e-script  

## 2012-04-07 ENCOUNTER — Encounter: Payer: Managed Care, Other (non HMO) | Admitting: Internal Medicine

## 2012-06-26 ENCOUNTER — Other Ambulatory Visit: Payer: Self-pay | Admitting: Internal Medicine

## 2012-06-26 DIAGNOSIS — Z1231 Encounter for screening mammogram for malignant neoplasm of breast: Secondary | ICD-10-CM

## 2012-07-31 ENCOUNTER — Ambulatory Visit: Payer: Managed Care, Other (non HMO)

## 2012-08-14 ENCOUNTER — Ambulatory Visit
Admission: RE | Admit: 2012-08-14 | Discharge: 2012-08-14 | Disposition: A | Discharge status: Home / Self Care | Payer: Managed Care, Other (non HMO) | Source: Ambulatory Visit | Attending: Internal Medicine | Admitting: Internal Medicine

## 2012-08-14 DIAGNOSIS — Z1231 Encounter for screening mammogram for malignant neoplasm of breast: Secondary | ICD-10-CM

## 2013-07-13 ENCOUNTER — Other Ambulatory Visit: Payer: Self-pay

## 2013-07-13 DIAGNOSIS — Z1231 Encounter for screening mammogram for malignant neoplasm of breast: Secondary | ICD-10-CM

## 2013-08-16 ENCOUNTER — Ambulatory Visit
Admission: RE | Admit: 2013-08-16 | Discharge: 2013-08-16 | Disposition: A | Discharge status: Home / Self Care | Payer: Managed Care, Other (non HMO) | Source: Ambulatory Visit

## 2013-08-16 DIAGNOSIS — Z1231 Encounter for screening mammogram for malignant neoplasm of breast: Secondary | ICD-10-CM

## 2014-07-15 ENCOUNTER — Other Ambulatory Visit: Payer: Self-pay

## 2014-07-15 DIAGNOSIS — Z1231 Encounter for screening mammogram for malignant neoplasm of breast: Secondary | ICD-10-CM

## 2014-08-17 ENCOUNTER — Ambulatory Visit
Admission: RE | Admit: 2014-08-17 | Discharge: 2014-08-17 | Disposition: A | Discharge status: Home / Self Care | Payer: Managed Care, Other (non HMO) | Source: Ambulatory Visit

## 2014-08-17 DIAGNOSIS — Z1231 Encounter for screening mammogram for malignant neoplasm of breast: Secondary | ICD-10-CM

## 2015-06-23 ENCOUNTER — Other Ambulatory Visit: Payer: Self-pay

## 2015-06-23 DIAGNOSIS — Z1231 Encounter for screening mammogram for malignant neoplasm of breast: Secondary | ICD-10-CM

## 2015-08-21 ENCOUNTER — Ambulatory Visit
Admission: RE | Admit: 2015-08-21 | Discharge: 2015-08-21 | Disposition: A | Discharge status: Home / Self Care | Payer: Managed Care, Other (non HMO) | Source: Ambulatory Visit

## 2015-08-21 DIAGNOSIS — Z1231 Encounter for screening mammogram for malignant neoplasm of breast: Secondary | ICD-10-CM

## 2016-02-06 ENCOUNTER — Encounter: Payer: Self-pay | Admitting: Gastroenterology

## 2016-04-01 ENCOUNTER — Encounter: Payer: Self-pay | Admitting: Gastroenterology

## 2016-05-24 ENCOUNTER — Encounter: Payer: Self-pay | Admitting: Gastroenterology

## 2016-07-19 ENCOUNTER — Ambulatory Visit (AMBULATORY_SURGERY_CENTER): Payer: Self-pay

## 2016-07-19 VITALS — Ht 67.0 in | Wt 191.8 lb

## 2016-07-19 DIAGNOSIS — Z1211 Encounter for screening for malignant neoplasm of colon: Secondary | ICD-10-CM

## 2016-07-19 NOTE — Progress Notes (Signed)
No allergies to eggs or soy No past problems with anesthesia No diet meds No home oxygen  Declined emmi 

## 2016-07-22 ENCOUNTER — Other Ambulatory Visit: Payer: Self-pay | Admitting: Internal Medicine

## 2016-07-22 DIAGNOSIS — Z1231 Encounter for screening mammogram for malignant neoplasm of breast: Secondary | ICD-10-CM

## 2016-07-25 ENCOUNTER — Encounter: Payer: Self-pay | Admitting: Gastroenterology

## 2016-07-26 ENCOUNTER — Telehealth: Payer: Self-pay | Admitting: Gastroenterology

## 2016-07-26 DIAGNOSIS — Z1211 Encounter for screening for malignant neoplasm of colon: Secondary | ICD-10-CM

## 2016-07-26 MED ORDER — SUPREP BOWEL PREP KIT 17.5-3.13-1.6 GM/177ML PO SOLN
1.0000 | Freq: Once | ORAL | 0 refills | Status: AC
Start: 2016-07-26 — End: 2016-07-26

## 2016-07-26 NOTE — Telephone Encounter (Signed)
Sent Suprep to [facility] with associated dx average risk screening and signed for DJ.  Then reached pt via phone and advised her of Rx sent to pharmacy.                                                               Kristel Durkee/PV

## 2016-08-02 ENCOUNTER — Ambulatory Visit (AMBULATORY_SURGERY_CENTER): Payer: Managed Care, Other (non HMO) | Admitting: Gastroenterology

## 2016-08-02 ENCOUNTER — Encounter: Payer: Self-pay | Admitting: Gastroenterology

## 2016-08-02 VITALS — BP 140/79 | HR 64 | Temp 97.5°F | Resp 25 | Ht 67.0 in | Wt 208.0 lb

## 2016-08-02 DIAGNOSIS — Z1211 Encounter for screening for malignant neoplasm of colon: Secondary | ICD-10-CM

## 2016-08-02 DIAGNOSIS — Z1212 Encounter for screening for malignant neoplasm of rectum: Secondary | ICD-10-CM | POA: Diagnosis not present

## 2016-08-02 MED ORDER — SODIUM CHLORIDE 0.9 % IV SOLN: 500.0000 mL | INTRAVENOUS | Status: DC

## 2016-08-02 NOTE — Progress Notes (Signed)
Report to PACU, RN, vss, BBS= Clear.  

## 2016-08-02 NOTE — Op Note (Signed)
[place] Patient Name: Elliauna Draudt Procedure Date: 08/02/2016 8:20 AM MRN: [id number] Endoscopist: Milus Banister , MD Age: 69 Referring MD:  Date of Birth: [birth year] Gender: Female Account #: [id number] Procedure:                Colonoscopy Indications:              Screening for colorectal malignant neoplasm                            (colonoscopy Dr. Velora Heckler 2007 was normal) Medicines:                Monitored Anesthesia Care Procedure:                Pre-Anesthesia Assessment:                           - Prior to the procedure, a History and Physical                            was performed, and patient medications and                            allergies were reviewed. The patient's tolerance of                            previous anesthesia was also reviewed. The risks                            and benefits of the procedure and the sedation                            options and risks were discussed with the patient.                            All questions were answered, and informed consent                            was obtained. Prior Anticoagulants: The patient has                            taken no previous anticoagulant or antiplatelet                            agents. ASA Grade Assessment: II - A patient with                            mild systemic disease. After reviewing the risks                            and benefits, the patient was deemed in                            satisfactory condition to undergo the procedure.  After obtaining informed consent, the colonoscope                            was passed under direct vision. Throughout the                            procedure, the patient's blood pressure, pulse, and                            oxygen saturations were monitored continuously. The                            Model PCF-H190L [id number]) scope was introduced                            through the anus and  advanced to the the cecum,                            identified by appendiceal orifice and ileocecal                            valve. The colonoscopy was performed without                            difficulty. The patient tolerated the procedure                            well. The quality of the bowel preparation was                            excellent. The ileocecal valve, appendiceal                            orifice, and rectum were photographed. Scope In: 8:26:58 AM Scope Out: 8:37:46 AM Scope Withdrawal Time: 0 hours 7 minutes 49 seconds  Total Procedure Duration: 0 hours 10 minutes 48 seconds  Findings:                 The entire examined colon appeared normal on direct                            and retroflexion views. Complications:            No immediate complications. Estimated blood loss:                            None. Estimated Blood Loss:     Estimated blood loss: none. Impression:               - The entire examined colon is normal on direct and                            retroflexion views.                           - No specimens collected. Recommendation:           -  Patient has a contact number available for                            emergencies. The signs and symptoms of potential                            delayed complications were discussed with the                            patient. Return to normal activities tomorrow.                            Written discharge instructions were provided to the                            patient.                           - Resume previous diet.                           - Continue present medications.                           - Repeat colonoscopy in 10 years for screening                            purposes. ****There is no need for colon cancer                            screening by any method (including stool testing)                            prior to then**** Milus Banister, MD 08/02/2016 8:40:24 AM This  report has been signed electronically.

## 2016-08-02 NOTE — Patient Instructions (Signed)
YOU HAD AN ENDOSCOPIC PROCEDURE TODAY AT THE [facility]:   Refer to the procedure report that was given to you for any specific questions about what was found during the examination.  If the procedure report does not answer your questions, please call your gastroenterologist to clarify.  If you requested that your care partner not be given the details of your procedure findings, then the procedure report has been included in a sealed envelope for you to review at your convenience later.  YOU SHOULD EXPECT: Some feelings of bloating in the abdomen. Passage of more gas than usual.  Walking can help get rid of the air that was put into your GI tract during the procedure and reduce the bloating. If you had a lower endoscopy (such as a colonoscopy or flexible sigmoidoscopy) you may notice spotting of blood in your stool or on the toilet paper. If you underwent a bowel prep for your procedure, you may not have a normal bowel movement for a few days.  Please Note:  You might notice some irritation and congestion in your nose or some drainage.  This is from the oxygen used during your procedure.  There is no need for concern and it should clear up in a day or so.  SYMPTOMS TO REPORT IMMEDIATELY:   Following lower endoscopy (colonoscopy or flexible sigmoidoscopy):  Excessive amounts of blood in the stool  Significant tenderness or worsening of abdominal pains  Swelling of the abdomen that is new, acute  Fever of 100F or higher   For urgent or emergent issues, a gastroenterologist can be reached at any hour by calling [phone].   DIET:  We do recommend a small meal at first, but then you may proceed to your regular diet.  Drink plenty of fluids but you should avoid alcoholic beverages for 24 hours.  ACTIVITY:  You should plan to take it easy for the rest of today and you should NOT DRIVE or use heavy machinery until tomorrow (because of the sedation medicines used during the test).     FOLLOW UP: Our staff will call the number listed on your records the next business day following your procedure to check on you and address any questions or concerns that you may have regarding the information given to you following your procedure. If we do not reach you, we will leave a message.  However, if you are feeling well and you are not experiencing any problems, there is no need to return our call.  We will assume that you have returned to your regular daily activities without incident.  If any biopsies were taken you will be contacted by phone or by letter within the next 1-3 weeks.  Please call us at [phone] if you have not heard about the biopsies in 3 weeks.    SIGNATURES/CONFIDENTIALITY: You and/or your care partner have signed paperwork which will be entered into your electronic medical record.  These signatures attest to the fact that that the information above on your After Visit Summary has been reviewed and is understood.  Full responsibility of the confidentiality of this discharge information lies with you and/or your care-partner.  Thank-you for choosing us for your healthcare needs today. 

## 2016-08-05 ENCOUNTER — Telehealth: Payer: Self-pay

## 2016-08-05 NOTE — Telephone Encounter (Signed)
  Follow up Call-  Call back number 08/02/2016  Post procedure Call Back phone  # [phone]  Permission to leave phone message Yes  Some recent data might be hidden    Patient was called for follow up after her procedure on 08/02/2016. No answer at the number given for follow up phone call. A message was left on the answering machine. This was the second attempt to contact the patient.

## 2016-08-05 NOTE — Telephone Encounter (Signed)
  Follow up Call-  Call back number 08/02/2016  Post procedure Call Back phone  # [phone]  Permission to leave phone message Yes  Some recent data might be hidden    Patient was called for follow up after her procedure on 08/02/2016. No answer at the number given for follow up phone call. A message was left on the answering machine.

## 2016-08-06 NOTE — Telephone Encounter (Signed)
Pt said she is returning your call, but she is doing great since her procedure

## 2016-08-23 ENCOUNTER — Ambulatory Visit
Admission: RE | Admit: 2016-08-23 | Discharge: 2016-08-23 | Disposition: A | Discharge status: Home / Self Care | Payer: Managed Care, Other (non HMO) | Source: Ambulatory Visit | Attending: Internal Medicine | Admitting: Internal Medicine

## 2016-08-23 DIAGNOSIS — Z1231 Encounter for screening mammogram for malignant neoplasm of breast: Secondary | ICD-10-CM

## 2017-07-14 ENCOUNTER — Other Ambulatory Visit: Payer: Self-pay | Admitting: Internal Medicine

## 2017-07-14 DIAGNOSIS — Z1231 Encounter for screening mammogram for malignant neoplasm of breast: Secondary | ICD-10-CM

## 2017-09-01 ENCOUNTER — Ambulatory Visit
Admission: RE | Admit: 2017-09-01 | Discharge: 2017-09-01 | Disposition: A | Discharge status: Home / Self Care | Payer: Managed Care, Other (non HMO) | Source: Ambulatory Visit | Attending: Internal Medicine | Admitting: Internal Medicine

## 2017-09-01 DIAGNOSIS — Z1231 Encounter for screening mammogram for malignant neoplasm of breast: Secondary | ICD-10-CM

## 2018-07-24 ENCOUNTER — Other Ambulatory Visit: Payer: Self-pay | Admitting: Internal Medicine

## 2018-07-24 DIAGNOSIS — Z1231 Encounter for screening mammogram for malignant neoplasm of breast: Secondary | ICD-10-CM

## 2018-08-21 ENCOUNTER — Ambulatory Visit: Payer: 59 | Admitting: Nurse Practitioner

## 2018-08-21 ENCOUNTER — Encounter: Payer: Self-pay | Admitting: Nurse Practitioner

## 2018-08-21 VITALS — BP 124/72 | HR 80 | Ht 67.0 in | Wt 210.0 lb

## 2018-08-21 DIAGNOSIS — K219 Gastro-esophageal reflux disease without esophagitis: Secondary | ICD-10-CM

## 2018-08-21 NOTE — Progress Notes (Signed)
Chief Complaint:    GERD symptoms  IMPRESSION and PLAN:    72 year old female with chronic GERD.  Exacerbation of symptoms with regurgitation and burning throat over the last few months despite daily omeprazole which she had taken for years.   PCP changed PPI to Protonix on Monday of this week, she endorses mild improvement in symptoms so far. -Continue Protonix every morning, 30 minutes prior to breakfast. -Discussed antireflux measures. She drinks only one caffeinated beverage a day . She already goes to bed on an empty stomach.  We talked about elevating the head of the bed, if unable then will check into getting a wedge pillow.  Avoid excessive consumption of mint / chocolate.  She admits to some recent weigh gain and we discussed the negative impact that can have on GERD symptoms -Patient will call us with a condition update in a couple of weeks.  If not continuing to improve then I think we should consider EGD for GERD symptoms refractory to PPI  HPI:     Patient is a 72 year old female known to Dr. Ardis Hughs.  Patient has a history of chronic GERD, on omeprazole for years.  Over the last few months she has had exacerbation of GERD symptoms described as burning in throat, sour taste in mouth.  No dysphagia and like she just does not chew food well.  Symptoms mainly in the evening after dinner.  PCP recently stopped omeprazole, started her on Protonix this past Monday.  She endorses some mild improvement.  Patient says she goes to bed on an empty stomach, only has 1 caffeinated beverage a day.  Head of the bed is not elevated.  She has been gaining weight.  Other GI complaints.  Bowel moods are normal.  She is up-to-date on her colonoscopy, last one October 2017.   Review of systems:     No chest pain, no SOB, no fevers, no urinary sx   Past Medical History:  Diagnosis Date  . Diabetes ([place])    diet controlled  . GERD (gastroesophageal reflux disease)   . Hyperlipidemia   .  Hypertension   . Kidney stones   . Thyroid disease   . Urinary incontinence     Patient's surgical history, family medical history, social history, medications and allergies were all reviewed in Epic   Creatinine clearance cannot be calculated (Patient's most recent lab result is older than the maximum 21 days allowed.)  Current Outpatient Medications  Medication Sig Dispense Refill  . atenolol (TENORMIN) 100 MG tablet Take 1 tablet (100 mg total) by mouth daily. 90 tablet 3  . atorvastatin (LIPITOR) 20 MG tablet Take 1 tablet (20 mg total) by mouth daily. 90 tablet 3  . hydrochlorothiazide (MICROZIDE) 12.5 MG capsule Take 12.5 mg by mouth daily.     Marland Kitchen levothyroxine (SYNTHROID, LEVOTHROID) 100 MCG tablet Take 1 tablet (100 mcg total) by mouth daily. 90 tablet 3  . MEGARED OMEGA-3 KRILL OIL PO Take 1 capsule by mouth daily.     Glory Rosebush VERIO test strip USE TO SELF MONITOR BLOOD GLUCOSE DAILY  12  . pantoprazole (PROTONIX) 40 MG tablet Take 1 tablet by mouth daily before supper.     No current facility-administered medications for this visit.     Physical Exam:     BP 124/72   Pulse 80   Ht 5\' 7"  (1.702 m)   Wt 210 lb (95.3 kg)   BMI 32.89 kg/m   GENERAL:  Pleasant female in NAD PSYCH: : Cooperative, normal affect EENT:  conjunctiva pink, mucous membranes moist, neck supple without masses CARDIAC:  RRR, no murmur heard, no peripheral edema PULM: Normal respiratory effort, lungs CTA bilaterally, no wheezing ABDOMEN:  Nondistended, soft, nontender. No obvious masses, no hepatomegaly,  normal bowel sounds SKIN:  turgor, no lesions seen Musculoskeletal:  Normal muscle tone, normal strength NEURO: Alert and oriented x 3, no focal neurologic deficits   Tye Savoy , NP 08/21/2018, 11:57 AM

## 2018-08-21 NOTE — Patient Instructions (Signed)
If you are age 70 or older, your body mass index should be between 23-30. Your Body mass index is 32.89 kg/m. If this is out of the aforementioned range listed, please consider follow up with your Primary Care Provider.  If you are age 78 or younger, your body mass index should be between 19-25. Your Body mass index is 32.89 kg/m. If this is out of the aformentioned range listed, please consider follow up with your Primary Care Provider.   Continue Protonix - take 30 minutes before breakfast.  Call in 2-3 weeks with an update.   Thank you for choosing me and [facility].   Tye Savoy, NP

## 2018-08-24 NOTE — Progress Notes (Signed)
I agree with the above note, plan 

## 2018-09-07 ENCOUNTER — Ambulatory Visit
Admission: RE | Admit: 2018-09-07 | Discharge: 2018-09-07 | Disposition: A | Discharge status: Home / Self Care | Payer: Medicare HMO | Source: Ambulatory Visit | Attending: Internal Medicine | Admitting: Internal Medicine

## 2018-09-07 ENCOUNTER — Telehealth: Payer: Self-pay | Admitting: Nurse Practitioner

## 2018-09-07 DIAGNOSIS — Z1231 Encounter for screening mammogram for malignant neoplasm of breast: Secondary | ICD-10-CM

## 2018-09-07 NOTE — Telephone Encounter (Signed)
I spoke with this patient. She is taking the Pantoprazole as ordered. She continues to have symptoms of reflux most days but not every day. Some days her throat feels swollen when she wakes up and she feels like she won't be able to swallow. But as the days goes on she gets better. She says though she is better, she is still dealing with symptoms. She is willing to have the EGD as suggested in her office visit.

## 2018-09-07 NOTE — Telephone Encounter (Signed)
Patient states that she is still having burning in her throat and her stomach(was told to cb to advise how she was feeling after new meds)

## 2018-09-08 ENCOUNTER — Other Ambulatory Visit: Payer: Self-pay

## 2018-09-08 DIAGNOSIS — K219 Gastro-esophageal reflux disease without esophagitis: Secondary | ICD-10-CM

## 2018-09-08 MED ORDER — FAMOTIDINE 20 MG PO TABS: 20.0000 mg | ORAL_TABLET | Freq: Every day | ORAL | 3 refills | Status: DC

## 2018-09-08 NOTE — Telephone Encounter (Signed)
Dr Ardis Hughs would you review this for me please?

## 2018-09-08 NOTE — Telephone Encounter (Signed)
Discussed in detail with the patient. She is on vacation this week. It will be helpful for her to have this done this week.  EGD 09/11/18 She will come discuss the prep and sign the consent forms tomorrow.  [facility] [place] [facility] Pepcid Rx sent there for her.

## 2018-09-08 NOTE — Telephone Encounter (Signed)
Left message to call.  We will schedule her for a pre-visit and an EGD with Dr Ardis Hughs. Confirm her pharmacy for the medication.

## 2018-09-08 NOTE — Telephone Encounter (Signed)
Yes, lets go ahead with EGD. In the meantime I want her to add pepcid (OTC famotidine) 20mg  pills, one pill at bedtime every night. Continue protonix every AM.  Thanks

## 2018-09-08 NOTE — Telephone Encounter (Signed)
Pt returned your call and would like another call. °

## 2018-09-11 ENCOUNTER — Encounter: Payer: Self-pay | Admitting: Gastroenterology

## 2018-09-11 ENCOUNTER — Ambulatory Visit (AMBULATORY_SURGERY_CENTER): Payer: Medicare HMO | Admitting: Gastroenterology

## 2018-09-11 VITALS — BP 151/76 | HR 62 | Temp 98.0°F | Resp 15 | Ht 67.0 in | Wt 210.0 lb

## 2018-09-11 DIAGNOSIS — D131 Benign neoplasm of stomach: Secondary | ICD-10-CM | POA: Diagnosis not present

## 2018-09-11 DIAGNOSIS — K219 Gastro-esophageal reflux disease without esophagitis: Secondary | ICD-10-CM

## 2018-09-11 MED ORDER — SODIUM CHLORIDE 0.9 % IV SOLN: 500.0000 mL | Freq: Once | INTRAVENOUS | Status: AC

## 2018-09-11 NOTE — Patient Instructions (Signed)
YOU HAD AN ENDOSCOPIC PROCEDURE TODAY AT [facility]:   Refer to the procedure report that was given to you for any specific questions about what was found during the examination.  If the procedure report does not answer your questions, please call your gastroenterologist to clarify.  If you requested that your care partner not be given the details of your procedure findings, then the procedure report has been included in a sealed envelope for you to review at your convenience later.  YOU SHOULD EXPECT: Some feelings of bloating in the abdomen. Passage of more gas than usual.  Walking can help get rid of the air that was put into your GI tract during the procedure and reduce the bloating. If you had a lower endoscopy (such as a colonoscopy or flexible sigmoidoscopy) you may notice spotting of blood in your stool or on the toilet paper. If you underwent a bowel prep for your procedure, you may not have a normal bowel movement for a few days.  Please Note:  You might notice some irritation and congestion in your nose or some drainage.  This is from the oxygen used during your procedure.  There is no need for concern and it should clear up in a day or so.  SYMPTOMS TO REPORT IMMEDIATELY:   Following upper endoscopy (EGD)  Vomiting of blood or coffee ground material  New chest pain or pain under the shoulder blades  Painful or persistently difficult swallowing  New shortness of breath  Fever of 100F or higher  Black, tarry-looking stools  For urgent or emergent issues, a gastroenterologist can be reached at any hour by calling [phone].   DIET:  We do recommend a small meal at first, but then you may proceed to your regular diet.  Drink plenty of fluids but you should avoid alcoholic beverages for 24 hours.  ACTIVITY:  You should plan to take it easy for the rest of today and you should NOT DRIVE or use heavy machinery until tomorrow (because of the sedation medicines used  during the test).    FOLLOW UP: Our staff will call the number listed on your records the next business day following your procedure to check on you and address any questions or concerns that you may have regarding the information given to you following your procedure. If we do not reach you, we will leave a message.  However, if you are feeling well and you are not experiencing any problems, there is no need to return our call.  We will assume that you have returned to your regular daily activities without incident.  If any biopsies were taken you will be contacted by phone or by letter within the next 1-3 weeks.  Please call us at [phone] if you have not heard about the biopsies in 3 weeks.   Continue present medications   SIGNATURES/CONFIDENTIALITY: You and/or your care partner have signed paperwork which will be entered into your electronic medical record.  These signatures attest to the fact that that the information above on your After Visit Summary has been reviewed and is understood.  Full responsibility of the confidentiality of this discharge information lies with you and/or your care-partner.

## 2018-09-11 NOTE — Progress Notes (Signed)
Report to PACU, RN, vss, BBS= Clear.  

## 2018-09-11 NOTE — Op Note (Signed)
[facility] Patient Name: Sherry Harris Procedure Date: 09/11/2018 2:28 PM MRN: [id number] Endoscopist: Milus Banister , MD Age: 72 Referring MD:  Date of Birth: 07-22-1946 Gender: Female Account #: [id number] Procedure:                Upper GI endoscopy Indications:              Heartburn, Esophageal reflux symptoms that persist                            despite appropriate PPI therapy Medicines:                Monitored Anesthesia Care Procedure:                Pre-Anesthesia Assessment:                           - Prior to the procedure, a History and Physical                            was performed, and patient medications and                            allergies were reviewed. The patient's tolerance of                            previous anesthesia was also reviewed. The risks                            and benefits of the procedure and the sedation                            options and risks were discussed with the patient.                            All questions were answered, and informed consent                            was obtained. Prior Anticoagulants: The patient has                            taken no previous anticoagulant or antiplatelet                            agents. ASA Grade Assessment: II - A patient with                            mild systemic disease. After reviewing the risks                            and benefits, the patient was deemed in                            satisfactory condition to undergo the procedure.  After obtaining informed consent, the endoscope was                            passed under direct vision. Throughout the                            procedure, the patient's blood pressure, pulse, and                            oxygen saturations were monitored continuously. The                            Model GIF-HQ190 [id number]) scope was introduced                            through the mouth,  and advanced to the second part                            of duodenum. The upper GI endoscopy was                            accomplished without difficulty. The patient                            tolerated the procedure well. Scope In: Scope Out: Findings:                 Slightly irregular but non-nodular Z line                           Multiple, small (3-80mm) classic appearing fundic                            gland polyps.                           The exam was otherwise without abnormality. Complications:            No immediate complications. Estimated blood loss:                            None. Estimated Blood Loss:     Estimated blood loss: none. Impression:               - Slightly irregular but non-nodular Z line                           - Multiple, small (3-47mm) classic appearing fundic                            gland polyps.                           - No specimens collected. Recommendation:           - Patient has a contact number available for  emergencies. The signs and symptoms of potential                            delayed complications were discussed with the                            patient. Return to normal activities tomorrow.                            Written discharge instructions were provided to the                            patient.                           - Resume previous diet.                           - Continue present medications. Protonix 40mg  pill,                            shortly before breakfast every morning. Pepcid                            (famotidine) 20mg  at bedtime nightly Milus Banister, MD 09/11/2018 2:41:35 PM This report has been signed electronically.

## 2018-09-11 NOTE — Progress Notes (Signed)
Pt's states no medical or surgical changes since previsit or office visit. 

## 2018-09-14 ENCOUNTER — Telehealth: Payer: Self-pay | Admitting: *Deleted

## 2018-09-14 NOTE — Telephone Encounter (Signed)
  Follow up Call-  Call back number 09/11/2018 08/02/2016  Post procedure Call Back phone  # [phone]  Permission to leave phone message Yes Yes  Some recent data might be hidden     Patient questions:  Message left to call us if necessary.

## 2018-11-19 ENCOUNTER — Other Ambulatory Visit: Payer: Self-pay | Admitting: Gastroenterology

## 2018-11-20 ENCOUNTER — Telehealth: Payer: Self-pay | Admitting: Gastroenterology

## 2018-11-20 NOTE — Telephone Encounter (Signed)
Pt advised she is returning your call about the med famotidined [place]

## 2018-11-23 NOTE — Telephone Encounter (Signed)
Spoke to patient sent in RX for Famotidine

## 2019-08-02 ENCOUNTER — Other Ambulatory Visit: Payer: Self-pay | Admitting: Internal Medicine

## 2019-08-02 DIAGNOSIS — Z1231 Encounter for screening mammogram for malignant neoplasm of breast: Secondary | ICD-10-CM

## 2019-09-14 ENCOUNTER — Other Ambulatory Visit: Payer: Self-pay

## 2019-09-14 ENCOUNTER — Ambulatory Visit
Admission: RE | Admit: 2019-09-14 | Discharge: 2019-09-14 | Disposition: A | Discharge status: Home / Self Care | Payer: Medicare HMO | Source: Ambulatory Visit | Attending: Internal Medicine | Admitting: Internal Medicine

## 2019-09-14 DIAGNOSIS — Z1231 Encounter for screening mammogram for malignant neoplasm of breast: Secondary | ICD-10-CM

## 2019-11-12 ENCOUNTER — Ambulatory Visit: Payer: Managed Care, Other (non HMO) | Attending: Internal Medicine

## 2019-11-12 DIAGNOSIS — Z23 Encounter for immunization: Secondary | ICD-10-CM | POA: Insufficient documentation

## 2019-11-12 NOTE — Progress Notes (Signed)
   Covid-19 Vaccination Clinic  Name:  Sherry Harris    MRN: [id number] DOB: [birth year]  11/12/2019  [name] was observed post Covid-19 immunization for 15 minutes without incidence. She was provided with Vaccine Information Sheet and instruction to access the V-Safe system.   [name] was instructed to call 911 with any severe reactions post vaccine: Marland Kitchen Difficulty breathing  . Swelling of your face and throat  . A fast heartbeat  . A bad rash all over your body  . Dizziness and weakness    Immunizations Administered    Name Date Dose VIS Date Route   Pfizer COVID-19 Vaccine 11/12/2019 10:07 AM 0.3 mL 10/01/2019 Intramuscular   Manufacturer: [place]   Lot: [id number]   [place]: [id number]

## 2019-11-30 ENCOUNTER — Telehealth: Payer: Self-pay

## 2019-11-30 MED ORDER — FAMOTIDINE 20 MG PO TABS: ORAL_TABLET | ORAL | 3 refills | Status: DC

## 2019-11-30 NOTE — Telephone Encounter (Signed)
-----   Message from Milus Banister, MD sent at 11/30/2019  5:29 AM EST ----- Regarding: RE: Refill Yes, thanks.  One month with 11 refills or 3 months with 3 refills, whichever works best  ----- Message ----- From: Stevan Born, [place]: 11/29/2019   4:28 PM EST To: Milus Banister, MD Subject: Refill                                         Is it OK to refill famotidine 20mg  one tablet at bedtime?  Thanks

## 2019-11-30 NOTE — Telephone Encounter (Signed)
Rx for famotidine 20mg  one tablet at bedtime sent to pharmacy as requested.

## 2019-12-03 ENCOUNTER — Ambulatory Visit: Payer: Managed Care, Other (non HMO) | Attending: Internal Medicine

## 2019-12-03 DIAGNOSIS — Z23 Encounter for immunization: Secondary | ICD-10-CM | POA: Insufficient documentation

## 2019-12-03 NOTE — Progress Notes (Signed)
   Covid-19 Vaccination Clinic  Name:  Sherry Harris    MRN: [id number] DOB: 18-Dec-1945  12/03/2019  Sherry Harris was observed post Covid-19 immunization for 15 minutes without incidence. She was provided with Vaccine Information Sheet and instruction to access the V-Safe system.   Sherry Harris was instructed to call 911 with any severe reactions post vaccine: Marland Kitchen Difficulty breathing  . Swelling of your face and throat  . A fast heartbeat  . A bad rash all over your body  . Dizziness and weakness    Immunizations Administered    Name Date Dose VIS Date Route   Pfizer COVID-19 Vaccine 12/03/2019 11:22 AM 0.3 mL 10/01/2019 Intramuscular   Manufacturer: [place]   Lot: [id number]   [place]: [id number]

## 2020-08-07 ENCOUNTER — Other Ambulatory Visit: Payer: Self-pay | Admitting: Internal Medicine

## 2020-08-07 DIAGNOSIS — Z1231 Encounter for screening mammogram for malignant neoplasm of breast: Secondary | ICD-10-CM

## 2020-09-20 ENCOUNTER — Telehealth: Payer: Self-pay | Admitting: Oncology

## 2020-09-20 NOTE — Telephone Encounter (Signed)
Re: [facility]ab Infusion  Called to Discuss with patient about Covid symptoms and the use of regeneron, a monoclonal antibody infusion for those with mild to moderate Covid symptoms and at a high risk of hospitalization.     Pt is qualified for this infusion at the [place] [facility] infusion center due to co-morbid conditions and/or a member of an at-risk group.    Past Medical History:  Diagnosis Date  . Diabetes ([place])    diet controlled  . GERD (gastroesophageal reflux disease)   . Hyperlipidemia   . Hypertension   . Kidney stones   . Thyroid disease   . Urinary incontinence    Specific risk condition-age   Patient's COVID test came back negative. Patient has symptoms. She will re-test if she continues to have worsening of her symptoms.   Rulon Abide, AGNP-C [phone] ([place])

## 2020-10-09 ENCOUNTER — Other Ambulatory Visit: Payer: Self-pay

## 2020-10-09 ENCOUNTER — Ambulatory Visit
Admission: RE | Admit: 2020-10-09 | Discharge: 2020-10-09 | Disposition: A | Discharge status: Home / Self Care | Payer: 59 | Source: Ambulatory Visit | Attending: Internal Medicine | Admitting: Internal Medicine

## 2020-10-09 DIAGNOSIS — Z1231 Encounter for screening mammogram for malignant neoplasm of breast: Secondary | ICD-10-CM

## 2020-11-13 ENCOUNTER — Other Ambulatory Visit: Payer: Self-pay | Admitting: Gastroenterology

## 2021-02-09 ENCOUNTER — Other Ambulatory Visit: Payer: Self-pay | Admitting: Gastroenterology

## 2021-03-04 ENCOUNTER — Other Ambulatory Visit: Payer: Self-pay | Admitting: Gastroenterology

## 2021-03-27 ENCOUNTER — Other Ambulatory Visit: Payer: Self-pay | Admitting: Gastroenterology

## 2021-04-02 ENCOUNTER — Telehealth: Payer: Self-pay | Admitting: Nurse Practitioner

## 2021-04-04 MED ORDER — FAMOTIDINE 20 MG PO TABS: 20.0000 mg | ORAL_TABLET | Freq: Every day | ORAL | 0 refills | Status: DC

## 2021-04-04 NOTE — Telephone Encounter (Signed)
Rx sent to pharmacy as requested.

## 2021-08-05 ENCOUNTER — Other Ambulatory Visit: Payer: Self-pay | Admitting: Gastroenterology

## 2021-08-15 ENCOUNTER — Telehealth: Payer: Self-pay | Admitting: Gastroenterology

## 2021-08-15 ENCOUNTER — Encounter: Payer: Self-pay | Admitting: Gastroenterology

## 2021-08-15 MED ORDER — FAMOTIDINE 20 MG PO TABS: 20.0000 mg | ORAL_TABLET | Freq: Every day | ORAL | 0 refills | Status: DC

## 2021-08-15 NOTE — Telephone Encounter (Signed)
Patient is completely out of famotidine, 20 mg, one at bedtime.  She has an OV scheduled with Dr. Ardis Hughs 09/26/21 at 11:10 a.m.  Can you possibly call in enough for her until her appointment?  [facility] [place] [facility] [address] [phone]  Thank you.

## 2021-08-15 NOTE — Telephone Encounter (Signed)
Rx for famotidine sent to pharmacy as requested.  Further refills will be given to patient at appointment on 09-26-21.

## 2021-09-06 ENCOUNTER — Other Ambulatory Visit: Payer: Self-pay | Admitting: Internal Medicine

## 2021-09-06 DIAGNOSIS — Z1231 Encounter for screening mammogram for malignant neoplasm of breast: Secondary | ICD-10-CM

## 2021-09-26 ENCOUNTER — Ambulatory Visit: Payer: 59 | Admitting: Gastroenterology

## 2021-09-26 ENCOUNTER — Encounter: Payer: Self-pay | Admitting: Gastroenterology

## 2021-09-26 VITALS — BP 150/92 | HR 97 | Ht 67.0 in | Wt 218.2 lb

## 2021-09-26 DIAGNOSIS — K219 Gastro-esophageal reflux disease without esophagitis: Secondary | ICD-10-CM | POA: Diagnosis not present

## 2021-09-26 MED ORDER — PANTOPRAZOLE SODIUM 40 MG PO TBEC: 40.0000 mg | DELAYED_RELEASE_TABLET | Freq: Every day | ORAL | 3 refills | Status: DC

## 2021-09-26 MED ORDER — OMEPRAZOLE 40 MG PO CPDR: 40.0000 mg | DELAYED_RELEASE_CAPSULE | Freq: Every day | ORAL | 3 refills | Status: DC

## 2021-09-26 MED ORDER — FAMOTIDINE 20 MG PO TABS: 20.0000 mg | ORAL_TABLET | Freq: Every day | ORAL | 3 refills | Status: DC

## 2021-09-26 NOTE — Patient Instructions (Addendum)
If you are age 73 or older, your body mass index should be between 23-30. Your Body mass index is 34.18 kg/m. If this is out of the aforementioned range listed, please consider follow up with your Primary Care Provider. ________________________________________________________  The [facility] providers would like to encourage you to use [facility] to communicate with providers for non-urgent requests or questions.  Due to long hold times on the telephone, sending your provider a message by [facility] may be a faster and more efficient way to get a response.  Please allow 48 business hours for a response.  Please remember that this is for non-urgent requests.  _______________________________________________________  We have sent the following medications to your pharmacy for you to pick up at your convenience:  Pantoprazole 40mg  one capsule daily 30 minutes prior to breakfast. Famotidine 20mg  one tablet at night  You will need a follow up in our office in 2 years if you are still using prescribed medication at that time.  Please call our office to schedule.  Thank you for entrusting me with your care and choosing [facility].  Dr Ardis Hughs

## 2021-09-26 NOTE — Progress Notes (Signed)
Review of pertinent gastrointestinal problems: 1.  Routine risk for colon cancer.  Colonoscopy 2007 was normal.  Colonoscopy Dr. Ardis Hughs 07/2016 was also completely normal. 2.  Heartburn led to EGD November 2019 which showed slightly irregular, nonnodular Z-line and multiple classic appearing fundic gland polyps.  She was recommended to continue her Protonix 40 mg pills shortly before breakfast and Pepcid 20 mg at that time nightly.  HPI: This is a very pleasant 75 year old woman  I last saw her November 2019, 3 years ago at the time of her upper endoscopy.  See that results summarized above.  She is here for prescription refills for her antiacid medicines.  As long as she takes her omeprazole shortly before breakfast and her famotidine at bedtime she has no troubles with GERD.  She does not have dysphagia when she eats very very fast.  Her weight recently has been going up.  Review of systems: Pertinent positive and negative review of systems were noted in the above HPI section. All other review negative.   Past Medical History:  Diagnosis Date   Diabetes ([place])    diet controlled   GERD (gastroesophageal reflux disease)    Hyperlipidemia    Hypertension    Kidney stones    Thyroid disease    Urinary incontinence     Past Surgical History:  Procedure Laterality Date   CATARACT EXTRACTION     OD , right eye   KNEE ARTHROSCOPY     torn meniscus   LITHOTRIPSY  10/11   URETEROSCOPY  7782   caliceal diverticulum    Current Outpatient Medications  Medication Sig Dispense Refill   atenolol (TENORMIN) 100 MG tablet Take 1 tablet (100 mg total) by mouth daily. 90 tablet 3   atorvastatin (LIPITOR) 20 MG tablet Take 1 tablet (20 mg total) by mouth daily. 90 tablet 3   famotidine (PEPCID) 20 MG tablet Take 1 tablet (20 mg total) by mouth at bedtime. Refills will be given an upcoming appt scheduled for 09-26-21 90 tablet 0   hydrochlorothiazide (MICROZIDE) 12.5 MG capsule Take 12.5 mg  by mouth daily.      levothyroxine (SYNTHROID, LEVOTHROID) 100 MCG tablet Take 1 tablet (100 mcg total) by mouth daily. 90 tablet 3   MEGARED OMEGA-3 KRILL OIL PO Take 1 capsule by mouth daily.      ONETOUCH VERIO test strip USE TO SELF MONITOR BLOOD GLUCOSE DAILY  12   Current Facility-Administered Medications  Medication Dose Route Frequency Provider Last Rate Last Admin   0.9 %  sodium chloride infusion  500 mL Intravenous Once Milus Banister, MD        Allergies as of 09/26/2021 - Review Complete 09/26/2021  Allergen Reaction Noted   Amoxicillin  10/08/2010   Amoxicillin-pot clavulanate     Aspirin     Codeine sulfate     Diphenhydramine hcl     Etodolac     Lisinopril     Morphine and related  07/19/2016    Family History  Problem Relation Age of Onset   Alzheimer's disease Mother    Hypertension Mother    Diabetes Mother    Arthritis Father    Diabetes Father    Hypertension Father    Lung cancer Father        smoker   Breast cancer Maternal Grandmother    Colon cancer Neg Hx    Esophageal cancer Neg Hx    Rectal cancer Neg Hx    Stomach cancer Neg  Hx     Social History   Socioeconomic History   Marital status: Widowed    Spouse name: Not on file   Number of children: 1   Years of education: Not on file   Highest education level: Not on file  Occupational History   Occupation: [employer]    Employer: [employer]  Tobacco Use   Smoking status: Former    Years: 15.00    Types: Cigarettes   Smokeless tobacco: Never  Vaping Use   Vaping Use: Never used  Substance and Sexual Activity   Alcohol use: No   Drug use: No   Sexual activity: Not on file  Other Topics Concern   Not on file  Social History Narrative   Not on file   Social Determinants of Health   Financial Resource Strain: Not on file  Food Insecurity: Not on file  Transportation Needs: Not on file  Physical Activity: Not on file  Stress: Not on file  Social Connections: Not on  file  Intimate Partner Violence: Not on file     Physical Exam: BP (!) 150/92   Pulse 97   Ht 5\' 7"  (1.702 m)   Wt 218 lb 4 oz (99 kg)   BMI 34.18 kg/m  Constitutional: generally well-appearing Psychiatric: alert and oriented x3 Eyes: extraocular movements intact Mouth: oral pharynx moist, no lesions Neck: supple no lymphadenopathy Cardiovascular: heart regular rate and rhythm Lungs: clear to auscultation bilaterally Abdomen: soft, nontender, nondistended, no obvious ascites, no peritoneal signs, normal bowel sounds Extremities: no lower extremity edema bilaterally Skin: no lesions on visible extremities   Assessment and plan: 74 y.o. female with GERD without alarm symptoms  I am happy to refill her proton pump inhibitor and her H2 blocker at her previous doses.  40 mg omeprazole 1 pill daily for breakfast and also famotidine 20 mg 1 pill at bedtime every night.  I will refill these again in 1 year.  If she still needs prescription antiacid medicines in 2 years she knows that I would like to see her in the office to go over these things again with her.   Please see the "Patient Instructions" section for addition details about the plan.   Owens Loffler, MD [facility] 09/26/2021, 11:22 AM  Cc: Velna Hatchet, MD  Total time on date of encounter was 30  minutes (this included time spent preparing to see the patient reviewing records; obtaining and/or reviewing separately obtained history; performing a medically appropriate exam and/or evaluation; counseling and educating the patient and family if present; ordering medications, tests or procedures if applicable; and documenting clinical information in the health record).

## 2021-10-23 ENCOUNTER — Ambulatory Visit
Admission: RE | Admit: 2021-10-23 | Discharge: 2021-10-23 | Disposition: A | Discharge status: Home / Self Care | Payer: 59 | Source: Ambulatory Visit | Attending: Internal Medicine | Admitting: Internal Medicine

## 2021-10-23 ENCOUNTER — Other Ambulatory Visit: Payer: Self-pay

## 2021-10-23 DIAGNOSIS — Z1231 Encounter for screening mammogram for malignant neoplasm of breast: Secondary | ICD-10-CM

## 2022-08-07 ENCOUNTER — Other Ambulatory Visit: Payer: Self-pay | Admitting: Gastroenterology

## 2022-08-08 NOTE — Telephone Encounter (Signed)
Good morning Dr Lyndel Safe,  This is a patient of Dr Ardis Hughs.  I am sending you the refill request as you are DOD am.  Please advise refills.

## 2022-09-19 ENCOUNTER — Other Ambulatory Visit: Payer: Self-pay | Admitting: Gastroenterology

## 2022-09-19 ENCOUNTER — Other Ambulatory Visit: Payer: Self-pay | Admitting: Internal Medicine

## 2022-09-19 DIAGNOSIS — Z1231 Encounter for screening mammogram for malignant neoplasm of breast: Secondary | ICD-10-CM

## 2022-10-28 ENCOUNTER — Telehealth: Payer: Self-pay | Admitting: Gastroenterology

## 2022-10-29 NOTE — Telephone Encounter (Signed)
Patient is returning call from DD informed her of her refill and that she will need to schedule an appointment for any additional refills, states she will call back to schedule.

## 2022-10-29 NOTE — Telephone Encounter (Signed)
Please advise. 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 She was last seen in the office 09/2021.

## 2022-10-29 NOTE — Telephone Encounter (Signed)
Will sign prescription for 3-month. She should be scheduled with one of the APPs or GI Mds in the interim, if she is going to continue to get refill requests after these 631-month Thanks. GM

## 2022-11-14 ENCOUNTER — Ambulatory Visit: Payer: 59

## 2022-12-30 ENCOUNTER — Ambulatory Visit
Admission: RE | Admit: 2022-12-30 | Discharge: 2022-12-30 | Disposition: A | Discharge status: Home / Self Care | Payer: Medicare Other | Source: Ambulatory Visit | Attending: Internal Medicine | Admitting: Internal Medicine

## 2022-12-30 DIAGNOSIS — Z1231 Encounter for screening mammogram for malignant neoplasm of breast: Secondary | ICD-10-CM

## 2023-03-05 ENCOUNTER — Other Ambulatory Visit: Payer: Self-pay | Admitting: Gastroenterology

## 2023-05-04 ENCOUNTER — Other Ambulatory Visit: Payer: Self-pay | Admitting: Gastroenterology

## 2023-06-13 ENCOUNTER — Other Ambulatory Visit: Payer: Self-pay | Admitting: Gastroenterology

## 2023-11-07 ENCOUNTER — Other Ambulatory Visit: Payer: Self-pay | Admitting: Gastroenterology

## 2024-02-11 ENCOUNTER — Other Ambulatory Visit: Payer: Self-pay | Admitting: Internal Medicine

## 2024-02-11 DIAGNOSIS — Z1231 Encounter for screening mammogram for malignant neoplasm of breast: Secondary | ICD-10-CM

## 2024-02-25 ENCOUNTER — Ambulatory Visit
Admission: RE | Admit: 2024-02-25 | Discharge: 2024-02-25 | Disposition: A | Discharge status: Home / Self Care | Source: Ambulatory Visit | Attending: Internal Medicine | Admitting: Internal Medicine

## 2024-02-25 DIAGNOSIS — Z1231 Encounter for screening mammogram for malignant neoplasm of breast: Secondary | ICD-10-CM
